# Patient Record
Sex: Male | Born: 1940
Health system: Southern US, Community
[De-identification: ages and names within clinical notes are randomized; demographics above are authoritative.]

## PROBLEM LIST (undated history)

## (undated) DIAGNOSIS — N400 Enlarged prostate without lower urinary tract symptoms: Secondary | ICD-10-CM

## (undated) DIAGNOSIS — R519 Headache, unspecified: Secondary | ICD-10-CM

## (undated) DIAGNOSIS — K4090 Unilateral inguinal hernia, without obstruction or gangrene, not specified as recurrent: Secondary | ICD-10-CM

## (undated) DIAGNOSIS — T6701XA Heatstroke and sunstroke, initial encounter: Secondary | ICD-10-CM

## (undated) DIAGNOSIS — G4730 Sleep apnea, unspecified: Secondary | ICD-10-CM

## (undated) DIAGNOSIS — Z87442 Personal history of urinary calculi: Secondary | ICD-10-CM

## (undated) DIAGNOSIS — R51 Headache: Secondary | ICD-10-CM

## (undated) DIAGNOSIS — E785 Hyperlipidemia, unspecified: Secondary | ICD-10-CM

## (undated) DIAGNOSIS — N289 Disorder of kidney and ureter, unspecified: Secondary | ICD-10-CM

## (undated) DIAGNOSIS — M1120 Other chondrocalcinosis, unspecified site: Secondary | ICD-10-CM

## (undated) HISTORY — DX: Heatstroke and sunstroke, initial encounter: T67.01XA

## (undated) HISTORY — DX: Sleep apnea, unspecified: G47.30

## (undated) HISTORY — DX: Other chondrocalcinosis, unspecified site: M11.20

## (undated) HISTORY — PX: COLONOSCOPY: SHX174

## (undated) HISTORY — PX: EYE SURGERY: SHX253

## (undated) HISTORY — PX: HERNIA REPAIR: SHX51

## (undated) HISTORY — DX: Benign prostatic hyperplasia without lower urinary tract symptoms: N40.0

## (undated) HISTORY — DX: Unilateral inguinal hernia, without obstruction or gangrene, not specified as recurrent: K40.90

---

## 2012-11-29 LAB — PSA

## 2014-02-17 ENCOUNTER — Ambulatory Visit: Payer: Self-pay | Admitting: Family Medicine

## 2014-02-28 HISTORY — PX: CATARACT EXTRACTION: SUR2

## 2014-07-29 ENCOUNTER — Encounter: Payer: Self-pay | Admitting: Emergency Medicine

## 2014-07-29 ENCOUNTER — Emergency Department
Admission: EM | Admit: 2014-07-29 | Discharge: 2014-07-29 | Disposition: A | Discharge status: Home / Self Care | Payer: Medicare PPO | Attending: Emergency Medicine | Admitting: Emergency Medicine

## 2014-07-29 DIAGNOSIS — K922 Gastrointestinal hemorrhage, unspecified: Secondary | ICD-10-CM | POA: Diagnosis not present

## 2014-07-29 DIAGNOSIS — K625 Hemorrhage of anus and rectum: Secondary | ICD-10-CM | POA: Diagnosis present

## 2014-07-29 HISTORY — DX: Disorder of kidney and ureter, unspecified: N28.9

## 2014-07-29 HISTORY — DX: Hyperlipidemia, unspecified: E78.5

## 2014-07-29 LAB — CBC
HEMATOCRIT: 44.7 % (ref 40.0–52.0)
HEMOGLOBIN: 14.6 g/dL (ref 13.0–18.0)
MCH: 29.7 pg (ref 26.0–34.0)
MCHC: 32.6 g/dL (ref 32.0–36.0)
MCV: 91.1 fL (ref 80.0–100.0)
Platelets: 262 10*3/uL (ref 150–440)
RBC: 4.91 MIL/uL (ref 4.40–5.90)
RDW: 13 % (ref 11.5–14.5)
WBC: 7.9 10*3/uL (ref 3.8–10.6)

## 2014-07-29 LAB — COMPREHENSIVE METABOLIC PANEL
ALK PHOS: 71 U/L (ref 38–126)
ALT: 20 U/L (ref 17–63)
ANION GAP: 10 (ref 5–15)
AST: 22 U/L (ref 15–41)
Albumin: 4.3 g/dL (ref 3.5–5.0)
BILIRUBIN TOTAL: 0.4 mg/dL (ref 0.3–1.2)
BUN: 22 mg/dL — AB (ref 6–20)
CO2: 27 mmol/L (ref 22–32)
Calcium: 9.6 mg/dL (ref 8.9–10.3)
Chloride: 103 mmol/L (ref 101–111)
Creatinine, Ser: 1.08 mg/dL (ref 0.61–1.24)
GFR calc non Af Amer: >60 mL/min (ref >60)
Glucose, Bld: 67 mg/dL (ref 65–99)
POTASSIUM: 4.5 mmol/L (ref 3.5–5.1)
SODIUM: 140 mmol/L (ref 135–145)
Total Protein: 7 g/dL (ref 6.5–8.1)

## 2014-07-29 LAB — TYPE AND SCREEN
ABO/RH(D): O POS
ANTIBODY SCREEN: NEGATIVE

## 2014-07-29 NOTE — ED Notes (Signed)
Pt presents with constipation since last Wednesday has had soft stools after giving himself an enema. Pt states started having some rectal bleeding on Thursday and is getting worse. Pt denies any pain but c/o weakness.

## 2014-07-29 NOTE — Discharge Instructions (Signed)
You were evaluated for bright red blood after constipation and enema use for the past few days. Your exam and evaluation the emergency department are reassuring. We discussed staying overnight for monitoring and observation versus going home, and we decided to go ahead let to go home tonight. Return to the emergency department for any new or worsening symptoms including dizziness, chest pain, shortness of breath, passing out, black stools, or any larger amount of passing bright red blood. We discussed stop the aspirin until you stop bleeding and at least 3 days after he stopped bleeding. Discussed with your primary care doctor or gastric neurologist about restarting the aspirin. You are referred to follow-up with a gastroenterologist, Dr. Marva Panda. Call tomorrow to make an appointment hopefully for this week.  Bloody Stools Bloody stools often mean that there is a problem in the digestive tract. Your caregiver may use the term "melena" to describe black, tarry, and bad smelling stools or "hematochezia" to describe red or maroon-colored stools. Blood seen in the stool can be caused by bleeding anywhere along the intestinal tract.  A black stool usually means that blood is coming from the upper part of the gastrointestinal tract (esophagus, stomach, or small bowel). Passing maroon-colored stools or bright red blood usually means that blood is coming from lower down in the large bowel or the rectum. However, sometimes massive bleeding in the stomach or small intestine can cause bright red bloody stools.  Consuming black licorice, lead, iron pills, medicines containing bismuth subsalicylate, or blueberries can also cause black stools. Your caregiver can test black stools to see if blood is present. It is important that the cause of the bleeding be found. Treatment can then be started, and the problem can be corrected. Rectal bleeding may not be serious, but you should not assume everything is okay until you know  the cause.It is very important to follow up with your caregiver or a specialist in gastrointestinal problems. CAUSES  Blood in the stools can come from various underlying causes.Often, the cause is not found during your first visit. Testing is often needed to discover the cause of bleeding in the gastrointestinal tract. Causes range from simple to serious or even life-threatening.Possible causes include:  Hemorrhoids.These are veins that are full of blood (engorged) in the rectum. They cause pain, inflammation, and may bleed.  Anal fissures.These are areas of painful tearing which may bleed. They are often caused by passing hard stool.  Diverticulosis.These are pouches that form on the colon over time, with age, and may bleed significantly.  Diverticulitis.This is inflammation in areas with diverticulosis. It can cause pain, fever, and bloody stools, although bleeding is rare.  Proctitis and colitis. These are inflamed areas of the rectum or colon. They may cause pain, fever, and bloody stools.  Polyps and cancer. Colon cancer is a leading cause of preventable cancer death.It often starts out as precancerous polyps that can be removed during a colonoscopy, preventing progression into cancer. Sometimes, polyps and cancer may cause rectal bleeding.  Gastritis and ulcers.Bleeding from the upper gastrointestinal tract (near the stomach) may travel through the intestines and produce black, sometimes tarry, often bad smelling stools. In certain cases, if the bleeding is fast enough, the stools may not be black, but red and the condition may be life-threatening. SYMPTOMS  You may have stools that are bright red and bloody, that are normal color with blood on them, or that are dark black and tarry. In some cases, you may only have blood in  the toilet bowl. Any of these cases need medical care. You may also have:  Pain at the anus or anywhere in the rectum.  Lightheadedness or feeling  faint.  Extreme weakness.  Nausea or vomiting.  Fever. DIAGNOSIS Your caregiver may use the following methods to find the cause of your bleeding:  Taking a medical history. Age is important. Older people tend to develop polyps and cancer more often. If there is anal pain and a hard, large stool associated with bleeding, a tear of the anus may be the cause. If blood drips into the toilet after a bowel movement, bleeding hemorrhoids may be the problem. The color and frequency of the bleeding are additional considerations. In most cases, the medical history provides clues, but seldom the final answer.  A visual and finger (digital) exam. Your caregiver will inspect the anal area, looking for tears and hemorrhoids. A finger exam can provide information when there is tenderness or a growth inside. In men, the prostate is also examined.  Endoscopy. Several types of small, long scopes (endoscopes) are used to view the colon.  In the office, your caregiver may use a rigid, or more commonly, a flexible viewing sigmoidoscope. This exam is called flexible sigmoidoscopy. It is performed in 5 to 10 minutes.  A more thorough exam is accomplished with a colonoscope. It allows your caregiver to view the entire 5 to 6 foot long colon. Medicine to help you relax (sedative) is usually given for this exam. Frequently, a bleeding lesion may be present beyond the reach of the sigmoidoscope. So, a colonoscopy may be the best exam to start with. Both exams are usually done on an outpatient basis. This means the patient does not stay overnight in the hospital or surgery center.  An upper endoscopy may be needed to examine your stomach. Sedation is used and a flexible endoscope is put in your mouth, down to your stomach.  A barium enema X-ray. This is an X-ray exam. It uses liquid barium inserted by enema into the rectum. This test alone may not identify an actual bleeding point. X-rays highlight abnormal shadows, such  as those made by lumps (tumors), diverticuli, or colitis. TREATMENT  Treatment depends on the cause of your bleeding.   For bleeding from the stomach or colon, the caregiver doing your endoscopy or colonoscopy may be able to stop the bleeding as part of the procedure.  Inflammation or infection of the colon can be treated with medicines.  Many rectal problems can be treated with creams, suppositories, or warm baths.  Surgery is sometimes needed.  Blood transfusions are sometimes needed if you have lost a lot of blood.  For any bleeding problem, let your caregiver know if you take aspirin or other blood thinners regularly. HOME CARE INSTRUCTIONS   Take any medicines exactly as prescribed.  Keep your stools soft by eating a diet high in fiber. Prunes (1 to 3 a day) work well for many people.  Drink enough water and fluids to keep your urine clear or pale yellow.  Take sitz baths if advised. A sitz bath is when you sit in a bathtub with warm water for 10 to 15 minutes to soak, soothe, and cleanse the rectal area.  If enemas or suppositories are advised, be sure you know how to use them. Tell your caregiver if you have problems with this.  Monitor your bowel movements to look for signs of improvement or worsening. SEEK MEDICAL CARE IF:   You do not  improve in the time expected.  Your condition worsens after initial improvement.  You develop any new symptoms. SEEK IMMEDIATE MEDICAL CARE IF:   You develop severe or prolonged rectal bleeding.  You vomit blood.  You feel weak or faint.  You have a fever. MAKE SURE YOU:  Understand these instructions.  Will watch your condition.  Will get help right away if you are not doing well or get worse. Document Released: 02/04/2002 Document Revised: 05/09/2011 Document Reviewed: 07/02/2010 [facility] Patient Information 2015 [place] [place]. This information is not intended to replace advice given to you by your health care provider.  Make sure you discuss any questions you have with your health care provider.

## 2014-07-29 NOTE — ED Provider Notes (Signed)
[facility] Emergency Department Provider Note   ____________________________________________  Time seen: 6:30 PM I have reviewed the triage vital signs and the triage nursing note.  HISTORY  Chief Complaint Rectal Bleeding   Historian Patient and wife  HPI Henry Lane is a 74 y.o. male  who is complaining of bright red blood per rectum. He's had constipation recently and tried an enema on Thursday. At that time it was kind of painful after that he did notice some bright red rectal bleeding. He's had no abdominal pain since then. He has had some relief of the constipation although he is still passing hard small bowel movements. He's never had rectal or GI bleeding in the past. He does take meloxicam for knee pain however he's not had any stomach pain or melena. He's not had any dizziness, chest pain, shortness breath, dizziness, passing out. He quantifies the amount is small. Symptoms are mild. He has had a colonoscopy over 5 years ago. He takes a baby aspirin a cause of his "age ". He's had small amount of bleeding multiple times per day since Thursday.    Past Medical History  Diagnosis Date  . Renal disorder   . Hyperlipidemia     There are no active problems to display for this patient.   History reviewed. No pertinent past surgical history.  No current outpatient prescriptions on file.  Aspirin  Allergies Review of patient's allergies indicates no known allergies.  No family history on file.  Social History History  Substance Use Topics  . Smoking status: Never Smoker   . Smokeless tobacco: Not on file  . Alcohol Use: No    Review of Systems  Constitutional: Negative for fever. Eyes: Negative for visual changes. ENT: Negative for sore throat. Cardiovascular: Negative for chest pain. Respiratory: Negative for shortness of breath. Gastrointestinal: Negative for abdominal pain, vomiting and diarrhea. Genitourinary: Negative for  dysuria. Musculoskeletal: Negative for back pain. Skin: Negative for rash. Neurological: Negative for headaches, focal weakness or numbness.  ____________________________________________   PHYSICAL EXAM:  VITAL SIGNS: ED Triage Vitals  Enc Vitals Group     BP 07/29/14 1521 135/69 mmHg     Pulse Rate 07/29/14 1521 65     Resp 07/29/14 1521 18     Temp 07/29/14 1521 98.9 F (37.2 C)     Temp Source 07/29/14 1521 Oral     SpO2 07/29/14 1521 100 %     Weight 07/29/14 1521 153 lb (69.4 kg)     Height 07/29/14 1521  (1.778 m)     Head Cir --      Peak Flow --      Pain Score --      Pain Loc --      Pain Edu? --      Excl. in GC? --      Constitutional: Alert and oriented. Well appearing and in no distress. Eyes: Conjunctivae are normal. PERRL. Normal extraocular movements. ENT   Head: Normocephalic and atraumatic.   Nose: No congestion/rhinnorhea.   Mouth/Throat: Mucous membranes are moist.   Neck: No stridor. Cardiovascular: Normal rate, regular rhythm.  No murmurs, rubs, or gallops. Respiratory: Normal respiratory effort without tachypnea nor retractions. Breath sounds are clear and equal bilaterally. No wheezes/rales/rhonchi. Gastrointestinal: Soft and nontender. No distention.  Genitourinary: Small external hemorrhoid, noninflamed. Rectal exam nontender and no masses or stool palpated. Small pink/red on glove Musculoskeletal: Nontender with normal range of motion in all extremities. No joint effusions.  No lower extremity tenderness nor edema. Neurologic:  Normal speech and language. No gross focal neurologic deficits are appreciated. Skin:  Skin is warm, dry and intact. No rash noted. Psychiatric: Mood and affect are normal. Speech and behavior are normal. Patient exhibits appropriate insight and judgment.  ____________________________________________   EKG  None ____________________________________________  LABS (pertinent  positives/negatives)  Hemoglobin 14.6, white blood count normal 7.9 Complete metabolic panel within normal limits   ____________________________________________  RADIOLOGY Radiologist results reviewed  None __________________________________________  PROCEDURES  Procedure(s) performed: None Critical Care performed: None  ____________________________________________   ED COURSE / ASSESSMENT AND PLAN  Pertinent labs & imaging results that were available during my care of the patient were reviewed by me and considered in my medical decision making (see chart for details).   Patient has had a small amount of bright red blood per rectum since having constipation and an enema on Thursday. His vitals, exam, and hemoglobin are stable and normal. I discussed with him possibly staying overnight for observation recheck a hemoglobin and check consultation, or go home and return for any new or worsening problems and have follow-up at the GI clinic. The patient and his wife would like to go home I do not think this is unreasonable. I do believe the bleeding is likely due to minimal trauma due to the enema and constipation rather than a more serious or emergency causes of GI bleeding. He does take meloxicam but I do not believe he is having an upper GI bleed. He has absolutely no abdominal pain or nausea. Has no other symptoms. Return precautions and discharge instruction were provided to patient. They understand to come back immediately for any new or worsening condition.   ___________________________________________   FINAL CLINICAL IMPRESSION(S) / ED DIAGNOSES   Final diagnoses:  Lower GI bleeding      Governor Rooksebecca Dao Memmott, MD 07/29/14 1910

## 2014-07-29 NOTE — ED Notes (Signed)
E - sig pad not working, pt received instructions and had all questions answered prior to dc.  Declined wheelchair and walked out.

## 2014-07-30 LAB — ABO/RH: ABO/RH(D): O POS

## 2014-08-04 ENCOUNTER — Ambulatory Visit: Payer: Self-pay | Admitting: Urgent Care

## 2014-08-04 ENCOUNTER — Telehealth: Payer: Self-pay | Admitting: Urgent Care

## 2014-08-04 NOTE — Telephone Encounter (Signed)
Ms Brendia Sacksudrey Dormer called wanting to know why her husbands [facility] was [facility] this [facility]. After speaking with Marcelino DusterMichelle in [place] office, I called Ms Margo AyeHall back and told her Marcelino DusterMichelle had spoken with her son Mellody DanceKeith earlier this morning and he stated the patient was doing fine, leaving to go out of town today and would like to cancel the appointment today and reschedule when they return from their trip. I assured Mrs Margo AyeHall that I spoke with the nurse and the nurse stated that if the patient had no active, visible bleeding it would be fine to reschedule to next week. Mrs Margo AyeHall was satisfied with that and rescheduled patient appointment to next week with Lorenza BurtonKandice Jones, NP. They will call if there are any problems before then.

## 2014-08-14 ENCOUNTER — Ambulatory Visit: Payer: Self-pay | Admitting: Urgent Care

## 2014-11-14 ENCOUNTER — Ambulatory Visit (INDEPENDENT_AMBULATORY_CARE_PROVIDER_SITE_OTHER): Payer: Medicare PPO | Admitting: Family Medicine

## 2014-11-14 ENCOUNTER — Encounter: Payer: Self-pay | Admitting: Family Medicine

## 2014-11-14 VITALS — BP 114/58 | HR 73 | Temp 98.1°F | Ht 68.0 in | Wt 150.0 lb

## 2014-11-14 DIAGNOSIS — E785 Hyperlipidemia, unspecified: Secondary | ICD-10-CM

## 2014-11-14 DIAGNOSIS — N400 Enlarged prostate without lower urinary tract symptoms: Secondary | ICD-10-CM | POA: Diagnosis not present

## 2014-11-14 DIAGNOSIS — Z5181 Encounter for therapeutic drug level monitoring: Secondary | ICD-10-CM

## 2014-11-14 DIAGNOSIS — M170 Bilateral primary osteoarthritis of knee: Secondary | ICD-10-CM | POA: Insufficient documentation

## 2014-11-14 DIAGNOSIS — M1120 Other chondrocalcinosis, unspecified site: Secondary | ICD-10-CM | POA: Insufficient documentation

## 2014-11-14 DIAGNOSIS — M129 Arthropathy, unspecified: Secondary | ICD-10-CM | POA: Diagnosis not present

## 2014-11-14 MED ORDER — LOVASTATIN 20 MG PO TABS: 20.0000 mg | ORAL_TABLET | Freq: Every day | ORAL | Status: DC

## 2014-11-14 MED ORDER — OMEGA-3-ACID ETHYL ESTERS 1 G PO CAPS: 1.0000 g | ORAL_CAPSULE | Freq: Two times a day (BID) | ORAL | Status: DC

## 2014-11-14 MED ORDER — TAMSULOSIN HCL 0.4 MG PO CAPS: 0.4000 mg | ORAL_CAPSULE | Freq: Every day | ORAL | Status: DC

## 2014-11-14 MED ORDER — FINASTERIDE 5 MG PO TABS: 5.0000 mg | ORAL_TABLET | Freq: Every day | ORAL | Status: DC

## 2014-11-14 NOTE — Assessment & Plan Note (Signed)
Check PSA today; reviewed urologist notes; continue medicines

## 2014-11-14 NOTE — Assessment & Plan Note (Signed)
With recent injection in the right knee; will be aware if glucose is a little elevated on today's nonfasting labs; he is on glucosamine-chondroitin, and has an orthopaedist that he will contact if/when he feels like he is in need of another injection

## 2014-11-14 NOTE — Progress Notes (Signed)
BP 114/58 mmHg  Pulse 73  Temp(Src) 98.1 F (36.7 C)  Ht  (1.727 m)  Wt 150 lb (68.04 kg)  BMI 22.81 kg/m2  SpO2 97%   Subjective:    Patient ID: Henry Lane, Henry Lane    DOB: May 30, 1940, 74 y.o.   MRN: [id number]  HPI: Henry Lane is a 74 y.o. Henry Lane  Chief Complaint  Patient presents with  . Hyperlipidemia    pt states he needs a refill on lovastatin   He has had knee pain; injury years ago, fell in a hole and hit left knee with cement; orthopaedist gave him a shot in the left knee around Christmas; he got an injection in the right knee not long ago  He has high cholesterol; needs refills of medicines; ate right much eggs last week, big breakfast every day; usually eats cereal every day but out to eat on Wednesdays and once on the weekend goes out; not much cheese; does have bacon with the eggs; occasional meat; mostly chicken and fish  He is taking pills for the large prostate; thinks last exam might be a year; we reviewed his last PSA done here (4.1) and PSA done at urologist (4.3); he is not sure the last time he saw his urologist, but our last note was March 2015  He had an episode of GI bleeding a while back he tells as we get ready to finish up; he went to the ER and they checked him out; they told him it was some medicine he was taking for his knees; he stopped that and everything has cleared up and he says he's fine; just wanted me to know  Relevant past medical, surgical, family and social history reviewed and updated as indicated. Interim medical history since our last visit reviewed. Past Medical History  Diagnosis Date  . Renal disorder   . Hyperlipidemia   . Benign enlargement of prostate   . Calcium pyrophosphate crystal disease    Allergies and medications reviewed and updated.  Review of Systems  Constitutional: Negative for unexpected weight change.  Respiratory: Negative for wheezing.   Cardiovascular: Negative for chest pain.  Genitourinary:  Positive for decreased urine volume (sometimes a weaker) and difficulty urinating. Negative for dysuria and hematuria.  Musculoskeletal: Positive for arthralgias.  Neurological: Negative for tremors.   Per HPI unless specifically indicated above     Objective:    BP 114/58 mmHg  Pulse 73  Temp(Src) 98.1 F (36.7 C)  Ht  (1.727 m)  Wt 150 lb (68.04 kg)  BMI 22.81 kg/m2  SpO2 97%  Wt Readings from Last 3 Encounters:  11/14/14 150 lb (68.04 kg)  04/28/14 154 lb (69.854 kg)  07/29/14 153 lb (69.4 kg)    Physical Exam  Constitutional: He appears well-developed and well-nourished. No distress.  HENT:  Head: Normocephalic and atraumatic.  Eyes: EOM are normal. No scleral icterus.  Neck: No thyromegaly present.  Cardiovascular: Normal rate and regular rhythm.   Pulmonary/Chest: Effort normal and breath sounds normal.  Abdominal: Soft. Bowel sounds are normal. He exhibits no distension.  Genitourinary: Rectal exam shows no mass. Prostate is enlarged (2+; no nodules, symmetric). Prostate is not tender.  Musculoskeletal: He exhibits no edema.  Neurological: Coordination normal.  Skin: Skin is warm and dry. No pallor.  Psychiatric: He has a normal mood and affect. His behavior is normal. Judgment and thought content normal.    Results for orders placed or performed in visit on  11/14/14  PSA  Result Value Ref Range   PSA from PP       Assessment & Plan:   Problem List Items Addressed This Visit      Musculoskeletal and Integument   Arthritis of both knees    With recent injection in the right knee; will be aware if glucose is a little elevated on today's nonfasting labs; he is on glucosamine-chondroitin, and has an orthopaedist that he will contact if/when he feels like he is in need of another injection        Genitourinary   Benign enlargement of prostate    Check PSA today; reviewed urologist notes; continue medicines      Relevant Medications   tamsulosin  (FLOMAX) 0.4 MG CAPS capsule   finasteride (PROSCAR) 5 MG tablet   Other Relevant Orders   PSA     Other   Hyperlipidemia - Primary    Check lipids today; cereal for breakfast, pack of Nabs and Pepsi and peanuts and popcorn for lunch; limit eggs and saturated fats; continue statin      Relevant Medications   lovastatin (MEVACOR) 20 MG tablet   omega-3 acid ethyl esters (LOVAZA) 1 G capsule   Other Relevant Orders   Lipid Panel w/o Chol/HDL Ratio   Medication monitoring encounter   Relevant Orders   Comprehensive metabolic panel       Follow up plan: Return in about 6 months (around 05/14/2015) for regular follow-up.   An after-visit summary was printed and given to the patient at check-out.  Please see the patient instructions which may contain other information and recommendations beyond what is mentioned above in the assessment and plan.  Meds ordered this encounter  Medications  . tamsulosin (FLOMAX) 0.4 MG CAPS capsule    Sig: Take 1 capsule (0.4 mg total) by mouth daily.    Dispense:  30 capsule    Refill:  12  . lovastatin (MEVACOR) 20 MG tablet    Sig: Take 1 tablet (20 mg total) by mouth at bedtime.    Dispense:  30 tablet    Refill:  6  . omega-3 acid ethyl esters (LOVAZA) 1 G capsule    Sig: Take 1 capsule (1 g total) by mouth 2 (two) times daily.    Dispense:  60 capsule    Refill:  12  . finasteride (PROSCAR) 5 MG tablet    Sig: Take 1 tablet (5 mg total) by mouth daily.    Dispense:  30 tablet    Refill:  12   Orders Placed This Encounter  Procedures  . Lipid Panel w/o Chol/HDL Ratio  . Comprehensive metabolic panel  . PSA

## 2014-11-14 NOTE — Patient Instructions (Addendum)
Try to limit eggs and bacon and sausage Try to get no more than three egg yolks per week We'll contact you with the lab results Stay on your medicines Try turmeric as a natural anti-inflammatory (for pain and arthritis). It comes in capsules where you buy aspirin and fish oil, but also as a spice where you buy pepper and garlic powder. Return in 6 months, but call sooner if needed Read about saw palmetto supplement for your prostate

## 2014-11-14 NOTE — Assessment & Plan Note (Signed)
Check lipids today; cereal for breakfast, pack of Nabs and Pepsi and peanuts and popcorn for lunch; limit eggs and saturated fats; continue statin

## 2014-11-15 LAB — COMPREHENSIVE METABOLIC PANEL
ALBUMIN: 4.5 g/dL (ref 3.5–4.8)
ALT: 14 IU/L (ref 0–44)
AST: 18 IU/L (ref 0–40)
Albumin/Globulin Ratio: 2.1 (ref 1.1–2.5)
Alkaline Phosphatase: 81 IU/L (ref 39–117)
BILIRUBIN TOTAL: 0.6 mg/dL (ref 0.0–1.2)
BUN / CREAT RATIO: 27 — AB (ref 10–22)
BUN: 23 mg/dL (ref 8–27)
CALCIUM: 9.7 mg/dL (ref 8.6–10.2)
CO2: 24 mmol/L (ref 18–29)
Chloride: 101 mmol/L (ref 97–108)
Creatinine, Ser: 0.85 mg/dL (ref 0.76–1.27)
GFR calc Af Amer: 99 mL/min/{1.73_m2} (ref >59)
GFR, EST NON AFRICAN AMERICAN: 85 mL/min/{1.73_m2} (ref >59)
GLUCOSE: 77 mg/dL (ref 65–99)
Globulin, Total: 2.1 g/dL (ref 1.5–4.5)
Potassium: 5 mmol/L (ref 3.5–5.2)
Sodium: 142 mmol/L (ref 134–144)
TOTAL PROTEIN: 6.6 g/dL (ref 6.0–8.5)

## 2014-11-15 LAB — LIPID PANEL W/O CHOL/HDL RATIO
Cholesterol, Total: 163 mg/dL (ref 100–199)
HDL: 61 mg/dL (ref >39)
LDL Calculated: 58 mg/dL (ref 0–99)
TRIGLYCERIDES: 222 mg/dL — AB (ref 0–149)
VLDL Cholesterol Cal: 44 mg/dL — ABNORMAL HIGH (ref 5–40)

## 2014-11-15 LAB — PSA: Prostate Specific Ag, Serum: 1.4 ng/mL (ref 0.0–4.0)

## 2014-11-17 ENCOUNTER — Encounter: Payer: Self-pay | Admitting: Family Medicine

## 2015-02-03 ENCOUNTER — Telehealth: Payer: Self-pay

## 2015-02-03 NOTE — Telephone Encounter (Signed)
[facility] [place] and his wife needing refills. I explained that the patient's use [facility] and they didn't need refills.  The pharmacist Trey Paula(Jeff) said that he knew that, but the mail order hasn't came and [facility] [place] [facility] [place] [facility] [place]. He asked if he could get a verbal order for a week and I agreed. A week supply of lovastatin.  Number to that pharmacy is [phone]

## 2015-04-14 ENCOUNTER — Other Ambulatory Visit: Payer: Self-pay | Admitting: Family Medicine

## 2015-04-14 NOTE — Telephone Encounter (Signed)
Last labs Sept 2016 reviewed Rx approved

## 2015-05-14 ENCOUNTER — Ambulatory Visit: Payer: Medicare PPO | Admitting: Family Medicine

## 2015-05-27 ENCOUNTER — Ambulatory Visit: Payer: Medicare PPO | Admitting: Family Medicine

## 2015-06-02 ENCOUNTER — Ambulatory Visit (INDEPENDENT_AMBULATORY_CARE_PROVIDER_SITE_OTHER): Payer: Medicare PPO | Admitting: Family Medicine

## 2015-06-02 ENCOUNTER — Encounter: Payer: Self-pay | Admitting: Family Medicine

## 2015-06-02 VITALS — BP 116/58 | HR 72 | Temp 98.1°F | Resp 14 | Wt 153.0 lb

## 2015-06-02 DIAGNOSIS — E785 Hyperlipidemia, unspecified: Secondary | ICD-10-CM | POA: Diagnosis not present

## 2015-06-02 DIAGNOSIS — N400 Enlarged prostate without lower urinary tract symptoms: Secondary | ICD-10-CM | POA: Diagnosis not present

## 2015-06-02 DIAGNOSIS — M129 Arthropathy, unspecified: Secondary | ICD-10-CM

## 2015-06-02 DIAGNOSIS — M170 Bilateral primary osteoarthritis of knee: Secondary | ICD-10-CM

## 2015-06-02 DIAGNOSIS — Z5181 Encounter for therapeutic drug level monitoring: Secondary | ICD-10-CM | POA: Diagnosis not present

## 2015-06-02 MED ORDER — FINASTERIDE 5 MG PO TABS: 5.0000 mg | ORAL_TABLET | Freq: Every day | ORAL | Status: DC

## 2015-06-02 MED ORDER — LOVASTATIN 20 MG PO TABS: 20.0000 mg | ORAL_TABLET | Freq: Every day | ORAL | Status: DC

## 2015-06-02 MED ORDER — TAMSULOSIN HCL 0.4 MG PO CAPS: 0.4000 mg | ORAL_CAPSULE | Freq: Every day | ORAL | Status: DC

## 2015-06-02 NOTE — Assessment & Plan Note (Signed)
Seeing orthopaedist, just had injections yesterday

## 2015-06-02 NOTE — Assessment & Plan Note (Signed)
Check sgpt and creatinine 

## 2015-06-02 NOTE — Assessment & Plan Note (Addendum)
Check PSA in September

## 2015-06-02 NOTE — Progress Notes (Signed)
BP 116/58 mmHg  Pulse 72  Temp(Src) 98.1 F (36.7 C) (Oral)  Resp 14  Wt 153 lb (69.4 kg)  SpO2 96%   Subjective:    Patient ID: Henry Lane, male    DOB: May 04, 1940, 75 y.o.   MRN: [id number]  HPI: Henry Lane is a 75 y.o. male  Chief Complaint  Patient presents with  . Medication Refill  . Hyperlipidemia   He saw the orthopaedist yesterday and got shots in both knees; never had any problems in the past with those (such as elevated sugars); injection sites are doing well; the right one bled a little in the office and they had him apply some pressure; otherwise, all is well; he is going to a car show this weekend and hopes to be able to walk around a lot  High cholesterol; tolerating statin just fine; we reviewed last lipid panel Lab Results  Component Value Date   CHOL 163 11/14/2014   Lab Results  Component Value Date   HDL 61 11/14/2014   Lab Results  Component Value Date   LDLCALC 58 11/14/2014   Lab Results  Component Value Date   TRIG 222* 11/14/2014   No results found for: CHOLHDL No results found for: LDLDIRECT  Takes prostate medicines; can tell a difference if he misses the dose; he saw a urologist; last PSA 1.4 in Sept 2016  Relevant past medical, surgical, family and social history reviewed and updated as indicated. Interim medical history since our last visit reviewed. Allergies and medications reviewed and updated.  Review of Systems  Per HPI unless specifically indicated above     Objective:    BP 116/58 mmHg  Pulse 72  Temp(Src) 98.1 F (36.7 C) (Oral)  Resp 14  Wt 153 lb (69.4 kg)  SpO2 96%  Wt Readings from Last 3 Encounters:  06/02/15 153 lb (69.4 kg)  11/14/14 150 lb (68.04 kg)  04/28/14 154 lb (69.854 kg)    Physical Exam  Constitutional: He appears well-developed and well-nourished. No distress.  Looks younger than stated age; spry, bright, energetic; no distress  Cardiovascular: Normal rate and regular rhythm.     Pulmonary/Chest: Effort normal and breath sounds normal.  Musculoskeletal:  Full extension of both knees; slight swelling at injection site right anterolateral knee; no erythema; no increased warmth  Psychiatric: He has a normal mood and affect. His speech is normal.    Results for orders placed or performed in visit on 11/14/14  Lipid Panel w/o Chol/HDL Ratio  Result Value Ref Range   Cholesterol, Total 163 100 - 199 mg/dL   Triglycerides 409222 (H) 0 - 149 mg/dL   HDL 61 >81>39 mg/dL   VLDL Cholesterol Cal 44 (H) 5 - 40 mg/dL   LDL Calculated 58 0 - 99 mg/dL  Comprehensive metabolic panel  Result Value Ref Range   Glucose 77 65 - 99 mg/dL   BUN 23 8 - 27 mg/dL   Creatinine, Ser 1.910.85 0.76 - 1.27 mg/dL   GFR calc non Af Amer 85 >59 mL/min/1.73   GFR calc Af Amer 99 >59 mL/min/1.73   BUN/Creatinine Ratio 27 (H) 10 - 22   Sodium 142 134 - 144 mmol/L   Potassium 5.0 3.5 - 5.2 mmol/L   Chloride 101 97 - 108 mmol/L   CO2 24 18 - 29 mmol/L   Calcium 9.7 8.6 - 10.2 mg/dL   Total Protein 6.6 6.0 - 8.5 g/dL   Albumin 4.5 3.5 - 4.8  g/dL   Globulin, Total 2.1 1.5 - 4.5 g/dL   Albumin/Globulin Ratio 2.1 1.1 - 2.5   Bilirubin Total 0.6 0.0 - 1.2 mg/dL   Alkaline Phosphatase 81 39 - 117 IU/L   AST 18 0 - 40 IU/L   ALT 14 0 - 44 IU/L  PSA  Result Value Ref Range   Prostate Specific Ag, Serum 1.4 0.0 - 4.0 ng/mL      Assessment & Plan:   Problem List Items Addressed This Visit      Musculoskeletal and Integument   Arthritis of both knees    Seeing orthopaedist, just had injections yesterday        Genitourinary   Benign enlargement of prostate    Check PSA in September      Relevant Medications   finasteride (PROSCAR) 5 MG tablet   tamsulosin (FLOMAX) 0.4 MG CAPS capsule     Other   Hyperlipidemia - Primary    Limit saturated fats, limit eggs to no more than 3 per weeks; continue statin      Relevant Medications   lovastatin (MEVACOR) 20 MG tablet   Other Relevant Orders    Lipid Panel w/o Chol/HDL Ratio   Medication monitoring encounter    Check sgpt and creatinine      Relevant Orders   Comprehensive metabolic panel      Follow up plan: Return in about 6 months (around 12/02/2015) for fasting labs, and a Medicare wellness visit when due.  Orders Placed This Encounter  Procedures  . Lipid Panel w/o Chol/HDL Ratio  . Comprehensive metabolic panel   Meds ordered this encounter  Medications  . lovastatin (MEVACOR) 20 MG tablet    Sig: Take 1 tablet (20 mg total) by mouth at bedtime.    Dispense:  90 tablet    Refill:  3  . finasteride (PROSCAR) 5 MG tablet    Sig: Take 1 tablet (5 mg total) by mouth daily.    Dispense:  90 tablet    Refill:  3  . tamsulosin (FLOMAX) 0.4 MG CAPS capsule    Sig: Take 1 capsule (0.4 mg total) by mouth daily.    Dispense:  90 capsule    Refill:  3   An after-visit summary was printed and given to the patient at check-out.  Please see the patient instructions which may contain other information and recommendations beyond what is mentioned above in the assessment and plan.

## 2015-06-02 NOTE — Patient Instructions (Signed)
We'll get labs today and contact you with the results Try to limit saturated fats in your diet (bologna, hot dogs, barbeque, cheeseburgers, hamburgers, steak, bacon, sausage, cheese, etc.) and get more fresh fruits, vegetables, and whole grains

## 2015-06-02 NOTE — Assessment & Plan Note (Signed)
Limit saturated fats, limit eggs to no more than 3 per weeks; continue statin

## 2015-06-03 ENCOUNTER — Encounter: Payer: Self-pay | Admitting: Family Medicine

## 2015-06-03 LAB — COMPREHENSIVE METABOLIC PANEL
A/G RATIO: 2.1 (ref 1.2–2.2)
ALBUMIN: 4.6 g/dL (ref 3.5–4.8)
ALT: 17 IU/L (ref 0–44)
AST: 20 IU/L (ref 0–40)
Alkaline Phosphatase: 75 IU/L (ref 39–117)
BILIRUBIN TOTAL: 0.5 mg/dL (ref 0.0–1.2)
BUN / CREAT RATIO: 37 — AB (ref 10–24)
BUN: 34 mg/dL — AB (ref 8–27)
CALCIUM: 9.7 mg/dL (ref 8.6–10.2)
CHLORIDE: 98 mmol/L (ref 96–106)
CO2: 23 mmol/L (ref 18–29)
Creatinine, Ser: 0.92 mg/dL (ref 0.76–1.27)
GFR calc non Af Amer: 81 mL/min/{1.73_m2} (ref >59)
GFR, EST AFRICAN AMERICAN: 93 mL/min/{1.73_m2} (ref >59)
Globulin, Total: 2.2 g/dL (ref 1.5–4.5)
Glucose: 106 mg/dL — ABNORMAL HIGH (ref 65–99)
POTASSIUM: 5.1 mmol/L (ref 3.5–5.2)
Sodium: 137 mmol/L (ref 134–144)
Total Protein: 6.8 g/dL (ref 6.0–8.5)

## 2015-06-03 LAB — LIPID PANEL W/O CHOL/HDL RATIO
Cholesterol, Total: 160 mg/dL (ref 100–199)
HDL: 79 mg/dL (ref >39)
LDL Calculated: 73 mg/dL (ref 0–99)
TRIGLYCERIDES: 42 mg/dL (ref 0–149)
VLDL Cholesterol Cal: 8 mg/dL (ref 5–40)

## 2015-07-13 ENCOUNTER — Other Ambulatory Visit: Payer: Self-pay | Admitting: Family Medicine

## 2015-07-13 ENCOUNTER — Encounter: Payer: Self-pay | Admitting: Family Medicine

## 2015-07-13 DIAGNOSIS — Z1211 Encounter for screening for malignant neoplasm of colon: Secondary | ICD-10-CM

## 2015-07-13 LAB — POC HEMOCCULT BLD/STL (HOME/3-CARD/SCREEN)
Card #2 Fecal Occult Blod, POC: NEGATIVE
Card #3 Fecal Occult Blood, POC: NEGATIVE
Fecal Occult Blood, POC: NEGATIVE

## 2015-07-31 ENCOUNTER — Telehealth: Payer: Self-pay | Admitting: Family Medicine

## 2015-07-31 NOTE — Telephone Encounter (Signed)
Thank you for the note; I'm sorry that we did not have any openings; I do recommend he get checked out by urgent care provider; thank you

## 2015-07-31 NOTE — Telephone Encounter (Signed)
Left voice mail

## 2015-07-31 NOTE — Telephone Encounter (Signed)
Wife called trying to schedule appointment today but we had nothing available. I did put him on cancellation list and also suggested urgent care. States he woke up with severe stomach pain with no diarrhea (in the middle of his stomach). By the time we ended the conversation his pain was over. I told her that i would send you a message making you aware of the situation.

## 2015-12-03 ENCOUNTER — Ambulatory Visit: Payer: Medicare PPO | Admitting: Family Medicine

## 2016-05-06 ENCOUNTER — Telehealth: Payer: Self-pay | Admitting: *Deleted

## 2016-05-06 ENCOUNTER — Encounter: Payer: Self-pay | Admitting: Family Medicine

## 2016-05-06 ENCOUNTER — Ambulatory Visit (INDEPENDENT_AMBULATORY_CARE_PROVIDER_SITE_OTHER): Payer: Medicare PPO | Admitting: Family Medicine

## 2016-05-06 ENCOUNTER — Telehealth: Payer: Self-pay

## 2016-05-06 DIAGNOSIS — K4090 Unilateral inguinal hernia, without obstruction or gangrene, not specified as recurrent: Secondary | ICD-10-CM | POA: Diagnosis not present

## 2016-05-06 DIAGNOSIS — R9431 Abnormal electrocardiogram [ECG] [EKG]: Secondary | ICD-10-CM | POA: Diagnosis not present

## 2016-05-06 DIAGNOSIS — G4719 Other hypersomnia: Secondary | ICD-10-CM

## 2016-05-06 DIAGNOSIS — G4730 Sleep apnea, unspecified: Secondary | ICD-10-CM | POA: Diagnosis not present

## 2016-05-06 HISTORY — DX: Unilateral inguinal hernia, without obstruction or gangrene, not specified as recurrent: K40.90

## 2016-05-06 LAB — CBC WITH DIFFERENTIAL/PLATELET
Basophils Absolute: 67 cells/uL (ref 0–200)
Basophils Relative: 1 %
Eosinophils Absolute: 201 cells/uL (ref 15–500)
Eosinophils Relative: 3 %
HCT: 45 % (ref 38.5–50.0)
Hemoglobin: 14.8 g/dL (ref 13.2–17.1)
LYMPHS PCT: 36 %
Lymphs Abs: 2412 cells/uL (ref 850–3900)
MCH: 30.3 pg (ref 27.0–33.0)
MCHC: 32.9 g/dL (ref 32.0–36.0)
MCV: 92 fL (ref 80.0–100.0)
MONOS PCT: 11 %
MPV: 10.6 fL (ref 7.5–12.5)
Monocytes Absolute: 737 cells/uL (ref 200–950)
Neutro Abs: 3283 cells/uL (ref 1500–7800)
Neutrophils Relative %: 49 %
PLATELETS: 295 10*3/uL (ref 140–400)
RBC: 4.89 MIL/uL (ref 4.20–5.80)
RDW: 13 % (ref 11.0–15.0)
WBC: 6.7 10*3/uL (ref 3.8–10.8)

## 2016-05-06 LAB — COMPLETE METABOLIC PANEL WITH GFR
ALT: 13 U/L (ref 9–46)
AST: 16 U/L (ref 10–35)
Albumin: 4.3 g/dL (ref 3.6–5.1)
Alkaline Phosphatase: 69 U/L (ref 40–115)
BUN: 22 mg/dL (ref 7–25)
CHLORIDE: 105 mmol/L (ref 98–110)
CO2: 28 mmol/L (ref 20–31)
CREATININE: 0.98 mg/dL (ref 0.70–1.18)
Calcium: 9.6 mg/dL (ref 8.6–10.3)
GFR, Est African American: 86 mL/min (ref >=60)
GFR, Est Non African American: 74 mL/min (ref >=60)
Glucose, Bld: 78 mg/dL (ref 65–99)
Potassium: 5.5 mmol/L — ABNORMAL HIGH (ref 3.5–5.3)
Sodium: 141 mmol/L (ref 135–146)
Total Bilirubin: 0.5 mg/dL (ref 0.2–1.2)
Total Protein: 6.7 g/dL (ref 6.1–8.1)

## 2016-05-06 NOTE — Assessment & Plan Note (Addendum)
Witnessed sleep apnea; strong suspicion that this precipitated the event; consult with Dr. Nicholos Johnsamachandran; explained this can be dangerous, even fatal; will try to see if emergency CPAP with auto-titration can be ordered (asking sleep specialist today)

## 2016-05-06 NOTE — Telephone Encounter (Signed)
Dr. Sherie DonLada called to speak with DS in regards to this pt needing a sleep study ASAP. Unable to get temporary CPAP until sleep study completed but will place an order for HST/ in lab which ever insurance will cover and schedule an appt with DR per DS.

## 2016-05-06 NOTE — Telephone Encounter (Signed)
Dr. Sung AmabileSimonds nurse called and stated he was not able to speak with Dr. lada because he was headed to the hospital. She stated they will try to get him an emergency temp CPAP machine but since the Pt has not head a sleep study done it might be hard to get the insurance company to pay for it.

## 2016-05-06 NOTE — Patient Instructions (Signed)
We'll have you see Dr. Nicholos Johnsamachandran as soon as possible We'll get the holter monitor and echocardiogram and labs Return in 10-14 days and we'll review results Call if needed If another event occurs, call 911

## 2016-05-06 NOTE — Telephone Encounter (Signed)
Thank you so much

## 2016-05-06 NOTE — Assessment & Plan Note (Addendum)
No priors for comparison; low voltage in the inferior leads (lead placement?); no pause, normal intervals; will get holter and echo; to ER, call 911 if episode occurs again

## 2016-05-06 NOTE — Assessment & Plan Note (Signed)
Refer to surgeon; explained risk of incarceration, reasons to go to the ER

## 2016-05-06 NOTE — Progress Notes (Signed)
BP 126/72   Pulse 81   Temp 97.9 F (36.6 C) (Oral)   Resp 16   Wt 157 lb 3 oz (71.3 kg)   SpO2 97%   BMI 23.90 kg/m    Subjective:    Patient ID: Henry Lane, male    DOB: 1940-12-23, 76 y.o.   MRN: [id number]  HPI: Henry Lane is a 76 y.o. male  Chief Complaint  Patient presents with  . Fatigue    clammy, pale and weakness and sweating at the beach. Last for 5-10 mins.  Wenesday night. Not happen after that .   Marland Kitchen. Hernia    Became bigger    He had an episode at the beach; lasted for 5-10 minutes; happened Wednesday night; clammy and pale and felt weak; he was sleeping; woke up; this was at 3 am; he got up out of bed, felt so weak that he crawled back in the bed; no funny heart beats; just felt weird; ate early that day wife thinks, then said no, they ate about 6 pm; event was at 3 am; just went away on its own No chest pain; no nausea; no weakness; no bad headaches; no trouble with speaking or swallowing; no leg swelling, no SHOB He snores heavily and stops breathing quite often says his wife; doesn't matter what position; this even happens in the chair; one night he stopped breathing so long, his wife wondered if he was ever going to start back He will often wake himself up in the middle of the night struggling to breathe  Hernia has gotten bigger, left inguinal hernia; had it about 3-4 years, maybe longer; still soft; had one on the right side fixed a long time ago  Pain along the left elbow right on the bone  [facility] 05/06/2016 06/02/2015  Decreased Interest 0 0  Down, Depressed, Hopeless 0 0  PHQ - 2 Score 0 0   Relevant past medical, surgical, family and social history reviewed Past Medical History:  Diagnosis Date  . Benign enlargement of prostate   . Calcium pyrophosphate crystal disease   . Hyperlipidemia   . Left inguinal hernia 05/06/2016  . Renal disorder    Past Surgical History:  Procedure Laterality Date  . HERNIA REPAIR     right  inguinal   Family History  Problem Relation Age of Onset  . Cancer Father     lung   Social History  Substance Use Topics  . Smoking status: Never Smoker  . Smokeless tobacco: Never Used  . Alcohol use Yes     Comment: on occasion   Interim medical history since last visit reviewed. Allergies and medications reviewed  Review of Systems Per HPI unless specifically indicated above     Objective:    BP 126/72   Pulse 81   Temp 97.9 F (36.6 C) (Oral)   Resp 16   Wt 157 lb 3 oz (71.3 kg)   SpO2 97%   BMI 23.90 kg/m   Wt Readings from Last 3 Encounters:  05/06/16 157 lb 3 oz (71.3 kg)  06/02/15 153 lb (69.4 kg)  11/14/14 150 lb (68 kg)    Physical Exam  Constitutional: He appears well-developed and well-nourished. No distress.  HENT:  Head: Normocephalic and atraumatic.  Eyes: EOM are normal. No scleral icterus.  Neck: No JVD present. Carotid bruit is not present. No thyromegaly present.  Cardiovascular: Normal rate and regular rhythm.   No murmur heard. Pulmonary/Chest:  Effort normal and breath sounds normal.  Abdominal: Soft. Bowel sounds are normal. He exhibits no distension. A hernia is present. Hernia confirmed positive in the left inguinal area.  Musculoskeletal: He exhibits no edema.  Neurological: He displays no tremor. Coordination normal.  Reflex Scores:      Patellar reflexes are 2+ on the right side and 2+ on the left side. Skin: Skin is warm and dry. He is not diaphoretic. No pallor.  Psychiatric: He has a normal mood and affect. His behavior is normal. Judgment and thought content normal. His mood appears not anxious. He does not exhibit a depressed mood.   Results for orders placed or performed in visit on 07/13/15  POC Hemoccult Bld/Stl (3-Cd Home Screen)  Result Value Ref Range   Card #1 Date 06/26/2015    Fecal Occult Blood, POC Negative Negative   Card #2 Date 06/29/2015    Card #2 Fecal Occult Blod, POC Negative    Card #3 Date 06/30/2015      Card #3 Fecal Occult Blood, POC Negative       Assessment & Plan:   Problem List Items Addressed This Visit      Respiratory   Sleep apnea    Witnessed sleep apnea; strong suspicion that this precipitated the event; consult with Dr. Nicholos Johns; explained this can be dangerous, even fatal; will try to see if emergency CPAP with auto-titration can be ordered (asking sleep specialist today)      Relevant Orders   Ambulatory referral to Pulmonology     Other   Left inguinal hernia    Refer to surgeon; explained risk of incarceration, reasons to go to the ER      Relevant Orders   Ambulatory referral to General Surgery   Abnormal EKG    No priors for comparison; low voltage in the inferior leads (lead placement?); no pause, normal intervals; will get holter and echo; to ER, call 911 if episode occurs again      Relevant Orders   Holter monitor - 24 hour   ECHOCARDIOGRAM COMPLETE   CBC with Differential/Platelet   COMPLETE METABOLIC PANEL WITH GFR   Troponin I      Follow up plan: No Follow-up on file.  An after-visit summary was printed and given to the patient at check-out.  Please see the patient instructions which may contain other information and recommendations beyond what is mentioned above in the assessment and plan.  No orders of the defined types were placed in this encounter.   Orders Placed This Encounter  Procedures  . CBC with Differential/Platelet  . COMPLETE METABOLIC PANEL WITH GFR  . Troponin I  . Ambulatory referral to General Surgery  . Ambulatory referral to Pulmonology  . Holter monitor - 24 hour  . ECHOCARDIOGRAM COMPLETE

## 2016-05-07 LAB — TROPONIN I: Troponin I: <0.01 ng/mL (ref <=0.05)

## 2016-05-10 ENCOUNTER — Ambulatory Visit (INDEPENDENT_AMBULATORY_CARE_PROVIDER_SITE_OTHER): Payer: Medicare PPO | Admitting: General Surgery

## 2016-05-10 ENCOUNTER — Other Ambulatory Visit: Payer: Self-pay

## 2016-05-10 ENCOUNTER — Encounter: Payer: Self-pay | Admitting: General Surgery

## 2016-05-10 VITALS — BP 112/58 | HR 78 | Resp 12 | Ht 69.0 in | Wt 158.0 lb

## 2016-05-10 DIAGNOSIS — K4090 Unilateral inguinal hernia, without obstruction or gangrene, not specified as recurrent: Secondary | ICD-10-CM | POA: Diagnosis not present

## 2016-05-10 DIAGNOSIS — E875 Hyperkalemia: Secondary | ICD-10-CM

## 2016-05-10 LAB — POTASSIUM: POTASSIUM: 5.8 mmol/L — AB (ref 3.5–5.3)

## 2016-05-10 NOTE — Patient Instructions (Addendum)
The patient is aware to call back for any questions or concerns.  Inguinal Hernia, Adult An inguinal hernia is when fat or the intestines push through the area where the leg meets the lower belly (groin) and make a rounded lump (bulge). This condition happens over time. There are three types of inguinal hernias. These types include:  Hernias that can be pushed back into the belly (are reducible).  Hernias that cannot be pushed back into the belly (are incarcerated).  Hernias that cannot be pushed back into the belly and lose their blood supply (get strangulated). This type needs emergency surgery. Follow these instructions at home: Lifestyle   Drink enough fluid to keep your urine (pee) clear or pale yellow.  Eat plenty of fruits, vegetables, and whole grains. These have a lot of fiber. Talk with your doctor if you have questions.  Avoid lifting heavy objects.  Avoid standing for long periods of time.  Do not use tobacco products. These include cigarettes, chewing tobacco, or e-cigarettes. If you need help quitting, ask your doctor.  Try to stay at a healthy weight. General instructions   Do not try to force the hernia back in.  Watch your hernia for any changes in color or size. Let your doctor know if there are any changes.  Take over-the-counter and prescription medicines only as told by your doctor.  Keep all follow-up visits as told by your doctor. This is important. Contact a doctor if:  You have a fever.  You have new symptoms.  Your symptoms get worse. Get help right away if:  The area where the legs meets the lower belly has:  Pain that gets worse suddenly.  A bulge that gets bigger suddenly and does not go down.  A bulge that turns red or purple.  A bulge that is painful to the touch.  You are a man and your scrotum:  Suddenly feels painful.  Suddenly changes in size.  You feel sick to your stomach (nauseous) and this feeling does not go  away.  You throw up (vomit) and this keeps happening.  You feel your heart beating a lot more quickly than normal.  You cannot poop (have a bowel movement) or pass gas. This information is not intended to replace advice given to you by your health care provider. Make sure you discuss any questions you have with your health care provider. Document Released: 03/17/2006 Document Revised: 07/23/2015 Document Reviewed: 12/25/2013 Elsevier Interactive Patient Education  2017 Elsevier Inc.  

## 2016-05-10 NOTE — Progress Notes (Signed)
Patient ID: Henry Lane, male   DOB: Nov 06, 1940, 76 y.o.   MRN: [id number]  Chief Complaint  Patient presents with  . Hernia    HPI Henry KlinefelterJames B Lane is a 76 y.o. male.  Patient is here today for an evaluation of an inguinal hernia. He states he has a left groin knot. He states that he noticed it about 5-6 years ago.  It does not seem to be causing any abdominal pain.  He has a history of right inguinal hernia repair done over 10 years ago. No nausea, vomiting, constipation or diarrhea noted. I have reviewed the history of present illness with the patient.   HPI  Past Medical History:  Diagnosis Date  . Benign enlargement of prostate   . Calcium pyrophosphate crystal disease   . Hyperlipidemia   . Left inguinal hernia 05/06/2016  . Renal disorder     Past Surgical History:  Procedure Laterality Date  . CATARACT EXTRACTION  2016  . COLONOSCOPY    . HERNIA REPAIR     right inguinal    Family History  Problem Relation Age of Onset  . Cancer Father     lung  . Colon cancer Neg Hx     Social History Social History  Substance Use Topics  . Smoking status: Never Smoker  . Smokeless tobacco: Never Used  . Alcohol use Yes     Comment: on occasion    No Known Allergies  Current Outpatient Prescriptions  Medication Sig Dispense Refill  . aspirin 81 MG tablet Take 81 mg by mouth daily.    . cetirizine (ZYRTEC) 10 MG tablet Take 10 mg by mouth daily.    . finasteride (PROSCAR) 5 MG tablet Take 1 tablet (5 mg total) by mouth daily. 90 tablet 3  . lovastatin (MEVACOR) 20 MG tablet Take 1 tablet (20 mg total) by mouth at bedtime. 90 tablet 3  . Multiple Vitamin (MULTIVITAMIN) tablet Take 1 tablet by mouth daily.    . tamsulosin (FLOMAX) 0.4 MG CAPS capsule Take 1 capsule (0.4 mg total) by mouth daily. 90 capsule 3   No current facility-administered medications for this visit.     Review of Systems Review of Systems  Constitutional: Negative.   Respiratory: Negative.    Cardiovascular: Negative.     Blood pressure (!) 112/58, pulse 78, resp. rate 12, height 5\' 9"  (1.753 m), weight 158 lb (71.7 kg).  Physical Exam Physical Exam  Constitutional: He is oriented to person, place, and time. He appears well-developed and well-nourished.  HENT:  Mouth/Throat: Oropharynx is clear and moist.  Eyes: Conjunctivae are normal. No scleral icterus.  Neck: Neck supple.  Cardiovascular: Normal rate, regular rhythm and normal heart sounds.   Pulmonary/Chest: Effort normal and breath sounds normal.  Abdominal: Soft. Normal appearance and bowel sounds are normal. There is no tenderness. A hernia is present. Hernia confirmed positive in the left inguinal area.  Large, reducible left inguinal hernia present  Lymphadenopathy:    He has no cervical adenopathy.  Neurological: He is alert and oriented to person, place, and time.  Skin: Skin is warm and dry.  Psychiatric: His behavior is normal.    Data Reviewed  None  Assessment    Large, reducible left inguinal hernia. Recent episode of dizziness, pallor sweating at night. PCP investigating this with sleep study and cardiac eaval    Plan    Surgery recommended. Procedure, risks/benfits explained.  Patient to follow-up with cardiology 05/11/16 and pulmonology on 05/24/16  as scheduled  If cleared to undergo surgery will schedule accordingly Return in 3 weeks for scheduling of inguinal hernia repair.       This information has been scribed by Dorathy Daft RN, BSN,BC.  SANKAR,SEEPLAPUTHUR G 05/10/2016, 3:38 PM

## 2016-05-11 ENCOUNTER — Ambulatory Visit
Admission: RE | Admit: 2016-05-11 | Discharge: 2016-05-11 | Disposition: A | Discharge status: Home / Self Care | Payer: Medicare PPO | Source: Ambulatory Visit | Attending: Family Medicine | Admitting: Family Medicine

## 2016-05-11 ENCOUNTER — Telehealth: Payer: Self-pay | Admitting: Family Medicine

## 2016-05-11 DIAGNOSIS — N289 Disorder of kidney and ureter, unspecified: Secondary | ICD-10-CM | POA: Diagnosis present

## 2016-05-11 DIAGNOSIS — G4730 Sleep apnea, unspecified: Secondary | ICD-10-CM | POA: Insufficient documentation

## 2016-05-11 DIAGNOSIS — I340 Nonrheumatic mitral (valve) insufficiency: Secondary | ICD-10-CM | POA: Diagnosis not present

## 2016-05-11 DIAGNOSIS — R9431 Abnormal electrocardiogram [ECG] [EKG]: Secondary | ICD-10-CM

## 2016-05-11 DIAGNOSIS — I517 Cardiomegaly: Secondary | ICD-10-CM | POA: Diagnosis not present

## 2016-05-11 DIAGNOSIS — E785 Hyperlipidemia, unspecified: Secondary | ICD-10-CM | POA: Insufficient documentation

## 2016-05-11 DIAGNOSIS — E875 Hyperkalemia: Secondary | ICD-10-CM

## 2016-05-11 MED ORDER — FUROSEMIDE 20 MG PO TABS: 10.0000 mg | ORAL_TABLET | Freq: Every day | ORAL | 0 refills | Status: DC

## 2016-05-11 NOTE — Progress Notes (Signed)
*  PRELIMINARY RESULTS* Echocardiogram 2D Echocardiogram has been performed.  Cristela BlueHege, Adilson Grafton 05/11/2016, 11:56 AM

## 2016-05-11 NOTE — Telephone Encounter (Signed)
I spoke with patient's wife They had the echocardiogram and holter monitor  We reviewed the echo report; no worries His potassium is high; his wife says he eats a ton of bananas and blueberries He also takes tart cherry juice cut out potassium-rich foods for a little while Avoid salt substitutes Taking vitamin D, baby aspirin, finesteride, tamsulosin, tart cherry juice Stop the tart cherry juice too He sees urologist, but no kidney disease Will use really low dose furosemide Thursday am and Friday am to help bring K+ level down, hopefully without dropping BP down; encouraged hydration, explained to wife Early Friday afternoon, run up to the hospital for labs Discussed case with colleague as well

## 2016-05-12 ENCOUNTER — Other Ambulatory Visit: Payer: Self-pay | Admitting: Family Medicine

## 2016-05-12 ENCOUNTER — Encounter: Payer: Self-pay | Admitting: Internal Medicine

## 2016-05-12 ENCOUNTER — Other Ambulatory Visit
Admission: RE | Admit: 2016-05-12 | Discharge: 2016-05-12 | Disposition: A | Discharge status: Home / Self Care | Payer: Medicare PPO | Source: Ambulatory Visit | Attending: Family Medicine | Admitting: Family Medicine

## 2016-05-12 DIAGNOSIS — G4719 Other hypersomnia: Secondary | ICD-10-CM

## 2016-05-12 DIAGNOSIS — E875 Hyperkalemia: Secondary | ICD-10-CM

## 2016-05-12 LAB — BASIC METABOLIC PANEL
Anion gap: 8 (ref 5–15)
BUN: 20 mg/dL (ref 6–20)
CALCIUM: 9.4 mg/dL (ref 8.9–10.3)
CO2: 29 mmol/L (ref 22–32)
CREATININE: 1.16 mg/dL (ref 0.61–1.24)
Chloride: 102 mmol/L (ref 101–111)
GFR calc Af Amer: >60 mL/min (ref >60)
GFR, EST NON AFRICAN AMERICAN: 59 mL/min — AB (ref >60)
Glucose, Bld: 120 mg/dL — ABNORMAL HIGH (ref 65–99)
Potassium: 4.4 mmol/L (ref 3.5–5.1)
SODIUM: 139 mmol/L (ref 135–145)

## 2016-05-12 LAB — MAGNESIUM: MAGNESIUM: 2.1 mg/dL (ref 1.7–2.4)

## 2016-05-12 NOTE — Assessment & Plan Note (Signed)
Recheck one more time in 1 week

## 2016-05-12 NOTE — Progress Notes (Signed)
Check labs here next week

## 2016-05-13 ENCOUNTER — Telehealth: Payer: Self-pay | Admitting: Internal Medicine

## 2016-05-13 ENCOUNTER — Telehealth: Payer: Self-pay

## 2016-05-13 ENCOUNTER — Telehealth: Payer: Self-pay | Admitting: Family Medicine

## 2016-05-13 DIAGNOSIS — G4733 Obstructive sleep apnea (adult) (pediatric): Secondary | ICD-10-CM

## 2016-05-13 NOTE — Telephone Encounter (Signed)
Called Pulmonology earlier this am got number for pager of on call doctor to see about reading sleep study asap.  I have paged twice with no return call.  Later this afternoon I called the [place] office back asked if I had correct pager number and to let them know I have not received a call back yet after 2 pages.  They told me they would get in contact with a doctor and have them call us back.  It is now 5 o'clock and we have still had no call back.  I have also got in contact with specialty services about his Holter result reading they were going to contact Dr. Roma SchanzParachose about reading his Holter ASAP as well.

## 2016-05-13 NOTE — Telephone Encounter (Signed)
Called spoke with wife he returned the Holter yesterday so it would not of captured episode.  I did call pulmonology, no doctor is at the clinic today so they gave me pager # and I have paged doctor on call.

## 2016-05-13 NOTE — Telephone Encounter (Signed)
-----   Message from Clemmie KrillLatisha A [place], [place] sent at 05/13/2016  4:44 PM EDT ----- Just got a call from [place] with [facility] stating that this patient will not be read until Monday, but as soon as it does they will give you a call.

## 2016-05-13 NOTE — Telephone Encounter (Signed)
I have contacted Dr. Marlise EvesLada's office and made them aware that we do not have any of our staff in [place] today. Dr. Sherene SiresWert can't address this message as he is not board certified in sleep. This message will need to be addressed by [facility] staff and MDs on 05/16/16.

## 2016-05-13 NOTE — Telephone Encounter (Signed)
Dr Sherie DonLada calling asking if one of the providers can please call her back She needs someone to please read the sleep study on patient Please advise

## 2016-05-13 NOTE — Telephone Encounter (Signed)
Please see if pulmonologist can interpret what happened on the sleep study If he had cardiac pause, then we need him to see cardiologist If it was sleep apnea, then he needs to be wearing the maching I really wish they had called 911 last night and gone to get checked out Contact pulm office first and I'll talk to them if needed Second, he should be wearing heart monitor, so see if they detected anything

## 2016-05-13 NOTE — Telephone Encounter (Signed)
This message will need to be addressed by the [facility] staff and MDs on 05/16/16.

## 2016-05-13 NOTE — Telephone Encounter (Signed)
See other phone note Thank you

## 2016-05-13 NOTE — Telephone Encounter (Signed)
Patient and wife concerned had another episode last night while wearing the sleep study machine hopefully it will show something.  They did not call EMS or go to ER.  He states is better this morning, denies any chest pain, SOB or symptoms.  They returned machine and they stated it could take up to 2 weeks for the results.  Please advise is there anything else we need to have them do?

## 2016-05-13 NOTE — Telephone Encounter (Signed)
I really appreciate your calls and hard work, Henry MuirJamie I talked to the patient's wife, explained we have tried to get holter results, tried to get sleep study results (he was hooked up during this most recent episode), but to no avail If any cardiac pauses, then we need to be concerned that it's his heart and he may need a pacemaker; if significant sleep apnea, he needs CPAP or BiPAP; I just don't have those answers on a Friday afternoon Patient says he's been out in the yard, looks great We discussed seriousness, may be life-threatening, just dont' know as we're working this up; discussed options; she agrees to call 911 if he has an further episodes We'll contact them at the first result

## 2016-05-13 NOTE — Telephone Encounter (Signed)
Can you call the holter device company and ask them to do a stat read on the information? I need to know if he was having any pauses or dysrrhythmias I have not heard back from pulmonologist so please page again Thank you

## 2016-05-16 ENCOUNTER — Encounter: Payer: Self-pay | Admitting: Family Medicine

## 2016-05-16 DIAGNOSIS — G4733 Obstructive sleep apnea (adult) (pediatric): Secondary | ICD-10-CM | POA: Diagnosis not present

## 2016-05-17 ENCOUNTER — Ambulatory Visit
Admission: RE | Admit: 2016-05-17 | Discharge: 2016-05-17 | Disposition: A | Discharge status: Home / Self Care | Payer: Medicare PPO | Source: Ambulatory Visit | Attending: Family Medicine | Admitting: Family Medicine

## 2016-05-17 DIAGNOSIS — R9431 Abnormal electrocardiogram [ECG] [EKG]: Secondary | ICD-10-CM | POA: Insufficient documentation

## 2016-05-17 DIAGNOSIS — I517 Cardiomegaly: Secondary | ICD-10-CM | POA: Insufficient documentation

## 2016-05-17 DIAGNOSIS — I340 Nonrheumatic mitral (valve) insufficiency: Secondary | ICD-10-CM | POA: Insufficient documentation

## 2016-05-17 NOTE — Telephone Encounter (Signed)
Received results from DR. Will call pt and inform Dr. Marlise EvesLada's office of the results.

## 2016-05-18 ENCOUNTER — Telehealth: Payer: Self-pay | Admitting: Family Medicine

## 2016-05-18 NOTE — Telephone Encounter (Signed)
Dr. Marlise EvesLada's office aware of results and next step for CPAP set up.

## 2016-05-18 NOTE — Telephone Encounter (Signed)
LM for Dr. Marlise EvesLada's nurse to give me a call back in regards to sleep results.  LMOVM for pt to return call.

## 2016-05-18 NOTE — Telephone Encounter (Signed)
They called stated they are setting him up for Auto cpap 5 to 20 and he follow-ups with pulmonology on the 27th

## 2016-05-18 NOTE — Telephone Encounter (Signed)
Misty from [facility] requesting return call to give test results [phone]

## 2016-05-18 NOTE — Telephone Encounter (Signed)
Wife informed of results. Order placed for auto CPAP. Nothing further needed.

## 2016-05-24 ENCOUNTER — Ambulatory Visit
Admission: RE | Admit: 2016-05-24 | Discharge: 2016-05-24 | Disposition: A | Discharge status: Home / Self Care | Payer: Medicare PPO | Source: Ambulatory Visit | Attending: Internal Medicine | Admitting: Internal Medicine

## 2016-05-24 ENCOUNTER — Encounter: Payer: Self-pay | Admitting: Internal Medicine

## 2016-05-24 ENCOUNTER — Ambulatory Visit (INDEPENDENT_AMBULATORY_CARE_PROVIDER_SITE_OTHER): Payer: Medicare PPO | Admitting: Internal Medicine

## 2016-05-24 VITALS — BP 126/70 | HR 65 | Wt 157.0 lb

## 2016-05-24 DIAGNOSIS — R0600 Dyspnea, unspecified: Secondary | ICD-10-CM | POA: Diagnosis not present

## 2016-05-24 DIAGNOSIS — G4733 Obstructive sleep apnea (adult) (pediatric): Secondary | ICD-10-CM | POA: Diagnosis not present

## 2016-05-24 NOTE — Progress Notes (Signed)
[facility] Consultation      Assessment and Plan:  Dyspnea/gasping. -Patient is noted to have occasional gasping/choking episodes at night that wake him up from sleep. Discussed that this may be secondary to objective sleep apnea, we will start treatment for sleep apnea and monitor his progress. These have not improved, then we'll need to consider further workup. Echo cardiogram was negative.  Obstructive sleep apnea. -HST on 05/15/16-OSA with an AHI of 35. Starting on auto CPAP.  Basal crackles in lung? Possible pulmonary edema vs. Interstitial lung disese.  -Bibasilar lung crackles noted on auscultation. This may be significant for pulmonary edema, though echocardiogram is normal. -We'll check a two-view chest x-ray    Date: 05/24/2016  MRN# [id number] Henry Lane April 13, 1940  Referring Physician: Dr. Olam Idler is a 76 y.o. old male seen in consultation for chief complaint of:    Chief Complaint  Patient presents with  . Advice Only    sleep per Lada: snores, apnea    HPI:   Patient is a 76 year old male, who is noted to have symptoms of excessive daytime sleepiness, symptoms consistent with obstructive sleep apnea. Due to his excessive daytime sleepiness symptoms, the patient was sent straight away for a home sleep study. Patient is noted to go to bed between 10 and 11 PM. He falls asleep quickly, within 5 minutes. Wakes up around 7 AM. His Epworth score is elevated at 12.During the sleep study, patient's wife apparently noted that he had another episode of gasping. He has not had another such spell since that time.   **Review of the sleep study performed on 05/12/16 showed an AHI of 35.6, consistent with severe obstructive sleep apnea. The patient was subsequent restarted on an auto titrating CPAP with pressure settings of 5-20. He has not yet been started on PAP.   **Review of Echo 05/11/16; EF=55%    PMHX:   Past Medical History:  Diagnosis  Date  . Benign enlargement of prostate   . Calcium pyrophosphate crystal disease   . Hyperlipidemia   . Left inguinal hernia 05/06/2016  . Renal disorder    Surgical Hx:  Past Surgical History:  Procedure Laterality Date  . CATARACT EXTRACTION  2016  . COLONOSCOPY    . HERNIA REPAIR     right inguinal   Family Hx:  Family History  Problem Relation Age of Onset  . Cancer Father     lung  . Colon cancer Neg Hx    Social Hx:   Social History  Substance Use Topics  . Smoking status: Never Smoker  . Smokeless tobacco: Never Used  . Alcohol use Yes     Comment: on occasion   Medication:   Reviewed.    Allergies:  Patient has no known allergies.  Review of Systems: Gen:  Denies  fever, sweats, chills HEENT: Denies blurred vision, double vision. bleeds, sore throat Cvc:  No dizziness, chest pain. Resp:   Denies cough or sputum production, shortness of breath Gi: Denies swallowing difficulty, stomach pain. Gu:  Denies bladder incontinence, burning urine Ext:   No Joint pain, stiffness. Skin: No skin rash,  hives  Endoc:  No polyuria, polydipsia. Psych: No depression, insomnia. Other:  All other systems were reviewed with the patient and were negative other that what is mentioned in the HPI.   Physical Examination:   VS: BP 126/70 (BP Location: Left Arm, Cuff Size: Normal)   Pulse 65   Wt 157 lb (  71.2 kg)   SpO2 93%   BMI 23.18 kg/m   General Appearance: No distress  Neuro:without focal findings,  speech normal,  HEENT: PERRLA, EOM intact.   Pulmonary: Bibasilar crackles, right greater than left. CardiovascularNormal S1,S2.  No m/r/g.   Abdomen: Benign, Soft, non-tender. Renal:  No costovertebral tenderness  GU:  No performed at this time. Endoc: No evident thyromegaly, no signs of acromegaly. Skin:   warm, no rashes, no ecchymosis  Extremities: normal, no cyanosis, clubbing.  Other findings:    LABORATORY PANEL:   CBC No results for input(s): WBC, HGB,  HCT, PLT in the last 168 hours. ------------------------------------------------------------------------------------------------------------------  Chemistries  No results for input(s): NA, K, CL, CO2, GLUCOSE, BUN, CREATININE, CALCIUM, MG, AST, ALT, ALKPHOS, BILITOT in the last 168 hours.  Invalid input(s): GFRCGP ------------------------------------------------------------------------------------------------------------------  Cardiac Enzymes No results for input(s): TROPONINI in the last 168 hours. ------------------------------------------------------------  RADIOLOGY:  No results found.     Thank  you for the consultation and for allowing [facility] to assist in the care of your patient. Our recommendations are noted above.  Please contact us if we can be of further service.   Wells Guileseep Codylee Patil, MD.  Board Certified in Internal Medicine, Pulmonary Medicine, Critical Care Medicine, and Sleep Medicine.  [facility] Office Number: [phone]  Santiago Gladavid Kasa, M.D.  Billy Fischeravid Simonds, M.D  05/24/2016

## 2016-05-24 NOTE — Patient Instructions (Signed)
Start Auto-CPAP range of 5-20.   Will check CXR.

## 2016-06-07 ENCOUNTER — Ambulatory Visit (INDEPENDENT_AMBULATORY_CARE_PROVIDER_SITE_OTHER): Payer: Medicare PPO | Admitting: General Surgery

## 2016-06-07 ENCOUNTER — Encounter: Payer: Self-pay | Admitting: General Surgery

## 2016-06-07 ENCOUNTER — Telehealth: Payer: Self-pay

## 2016-06-07 ENCOUNTER — Telehealth: Payer: Self-pay | Admitting: Family Medicine

## 2016-06-07 VITALS — BP 122/72 | HR 72 | Resp 13 | Ht 69.0 in | Wt 154.0 lb

## 2016-06-07 DIAGNOSIS — K4090 Unilateral inguinal hernia, without obstruction or gangrene, not specified as recurrent: Secondary | ICD-10-CM | POA: Diagnosis not present

## 2016-06-07 NOTE — Telephone Encounter (Signed)
Nurse with Dr. Marlise Eves office would like holter monitor result. Please call.

## 2016-06-07 NOTE — Telephone Encounter (Signed)
Called [facility] and spoke with [place]. She states she will message the nurse to call me back.

## 2016-06-07 NOTE — Patient Instructions (Addendum)
Inguinal Hernia, Adult An inguinal hernia is when fat or the intestines push through the area where the leg meets the lower abdomen (groin) and create a rounded lump (bulge). This condition develops over time. There are three types of inguinal hernias. These types include:  Hernias that can be pushed back into the belly (are reducible).  Hernias that are not reducible (are incarcerated).  Hernias that are not reducible and lose their blood supply (are strangulated). This type of hernia requires emergency surgery. What are the causes? This condition is caused by having a weak spot in the muscles or tissue. This weakness lets the hernia poke through. This condition can be triggered by:  Suddenly straining the muscles of the lower abdomen.  Lifting heavy objects.  Straining to have a bowel movement. Difficult bowel movements (constipation) can lead to this.  Coughing. What increases the risk? This condition is more likely to develop in:  Men.  Pregnant women.  People who:  Are overweight.  Work in jobs that require long periods of standing or heavy lifting.  Have had an inguinal hernia before.  Smoke or have lung disease. These factors can lead to long-lasting (chronic) coughing. What are the signs or symptoms? Symptoms can depend on the size of the hernia. Often, a small inguinal hernia has no symptoms. Symptoms of a larger hernia include:  A lump in the groin. This is easier to see when the person is standing. It might not be visible when he or she is lying down.  Pain or burning in the groin. This occurs especially when lifting, straining, or coughing.  A dull ache or a feeling of pressure in the groin.  A lump in the scrotum in men. Symptoms of a strangulated inguinal hernia can include:  A bulge in the groin that is very painful and tender to the touch.  A bulge that turns red or purple.  Fever, nausea, and vomiting.  The inability to have a bowel movement or to  pass gas. How is this diagnosed? This condition is diagnosed with a medical history and physical exam. Your health care provider may feel your groin area and ask you to cough. How is this treated? Treatment for this condition varies depending on the size of your hernia and whether you have symptoms. If you do not have symptoms, your health care provider may have you watch your hernia carefully and come in for follow-up visits. If your hernia is larger or if you have symptoms, your treatment will include surgery. Follow these instructions at home: Lifestyle   Drink enough fluid to keep your urine clear or pale yellow.  Eat a diet that includes a lot of fiber. Eat plenty of fruits, vegetables, and whole grains. Talk with your health care provider if you have questions.  Avoid lifting heavy objects.  Avoid standing for long periods of time.  Do not use tobacco products, including cigarettes, chewing tobacco, or e-cigarettes. If you need help quitting, ask your health care provider.  Maintain a healthy weight. General instructions   Do not try to force the hernia back in.  Watch your hernia for any changes in color or size. Let your health care provider know if any changes occur.  Take over-the-counter and prescription medicines only as told by your health care provider.  Keep all follow-up visits as told by your health care provider. This is important. Contact a health care provider if:  You have a fever.  You have new symptoms.  Your   symptoms get worse. Get help right away if:  You have pain in the groin that suddenly gets worse.  A bulge in the groin gets bigger suddenly and does not go down.  You are a man and you have a sudden pain in the scrotum, or the size of your scrotum suddenly changes.  A bulge in the groin area becomes red or purple and is painful to the touch.  You have nausea or vomiting that does not go away.  You feel your heart beating a lot more quickly  than normal.  You cannot have a bowel movement or pass gas. This information is not intended to replace advice given to you by your health care provider. Make sure you discuss any questions you have with your health care provider. Document Released: 07/03/2008 Document Revised: 07/23/2015 Document Reviewed: 12/25/2013 Elsevier Interactive Patient Education  2017 [employer].  The patient is scheduled for surgery at [facility] on 06/28/16, he will pre admit by phone. The patient is aware of date and instructions.

## 2016-06-07 NOTE — Telephone Encounter (Signed)
Thank you :)

## 2016-06-07 NOTE — Progress Notes (Addendum)
Patient ID: Henry Lane, male   DOB: 1940/09/11, 76 y.o.   MRN: [id number]  Chief Complaint  Patient presents with  . Follow-up    HPI Henry Lane is a 76 y.o. male.  Here today for follow up left inguinal hernia. He was evaluated here in 04/2016 and surgery was recommended, but at the time he was experiencing episodes of dizziness. He has since had a sleep study and cardiac evaluation.  Denies abdominal pain, nausea, vomiting, diarrhea.  No new issues. He is using his CPAP.  HPI  Past Medical History:  Diagnosis Date  . Benign enlargement of prostate   . Calcium pyrophosphate crystal disease   . Hyperlipidemia   . Left inguinal hernia 05/06/2016  . Renal disorder   . Sleep apnea    CPAP    Past Surgical History:  Procedure Laterality Date  . CATARACT EXTRACTION  2016  . COLONOSCOPY    . HERNIA REPAIR     right inguinal    Family History  Problem Relation Age of Onset  . Cancer Father     lung  . Colon cancer Neg Hx     Social History Social History  Substance Use Topics  . Smoking status: Never Smoker  . Smokeless tobacco: Never Used  . Alcohol use Yes     Comment: on occasion    No Known Allergies  Current Outpatient Prescriptions  Medication Sig Dispense Refill  . aspirin 81 MG tablet Take 81 mg by mouth daily.    . cetirizine (ZYRTEC) 10 MG tablet Take 10 mg by mouth daily.    . cholecalciferol (VITAMIN D) 1000 units tablet Take 1,000 Units by mouth daily.    . finasteride (PROSCAR) 5 MG tablet Take 1 tablet (5 mg total) by mouth daily. 90 tablet 3  . lovastatin (MEVACOR) 20 MG tablet Take 1 tablet (20 mg total) by mouth at bedtime. 90 tablet 3  . Multiple Vitamin (MULTIVITAMIN) tablet Take 1 tablet by mouth daily.    . tamsulosin (FLOMAX) 0.4 MG CAPS capsule Take 1 capsule (0.4 mg total) by mouth daily. 90 capsule 3   No current facility-administered medications for this visit.     Review of Systems Review of Systems  Constitutional: Negative.    Respiratory: Negative.   Cardiovascular: Negative.     Blood pressure 122/72, pulse 72, resp. rate 13, height  (1.753 m), weight 154 lb (69.9 kg).  Physical Exam Physical Exam  Constitutional: He is oriented to person, place, and time. He appears well-developed and well-nourished.  Eyes: Conjunctivae are normal. No scleral icterus.  Neck: Neck supple.  Cardiovascular: Normal rate, regular rhythm and normal heart sounds.   Pulmonary/Chest: Effort normal and breath sounds normal.  Abdominal: Soft. Normal appearance and bowel sounds are normal. There is no hepatomegaly. There is no tenderness. A hernia is present. Hernia confirmed positive in the left inguinal area.  Neurological: He is alert and oriented to person, place, and time.  Skin: Skin is warm and dry.  Psychiatric: His behavior is normal.    Data Reviewed Prior notes Sleep study - positive for sleep apnea. Cardiac echo= EF 55 % Assessment      Left inguinal hernia. No further dizzy spells. Based on good EF of heart, and pt now on CPAP  It is safe to proceed with planned repair of left inguinal hernia. Surgery can be accomplished with monitored anesthesia care and local anesthetic.   Plan   Hernia precautions and incarceration  were discussed with the patient. If they develop symptoms of an incarcerated hernia, they were encouraged to seek prompt medical attention.  I have recommended repair of the hernia using mesh on an outpatient basis in the near future. The risk of infection was reviewed. The role of prosthetic mesh to minimize the risk of recurrence was reviewed.  HPI, Physical Exam, Assessment and Plan have been scribed under the direction and in the presence of Kathreen Cosier, MD  Ples Specter, CMA       This information has been scribed by Dorathy Daft RN, BSN,BC.  The patient is scheduled for surgery at [facility] on 06/28/16, he will pre admit by phone. The patient is aware of date and instructions.    Documented by Sinda Du LPN  I have completed the exam and reviewed the above documentation for accuracy and completeness.  I agree with the above.  Museum/gallery conservator has been used and any errors in dictation or transcription are unintentional.  Chava Dulac G. Evette Cristal, M.D., F.A.C.S. Gerlene Burdock G 06/07/2016, 4:35 PM

## 2016-06-07 NOTE — Telephone Encounter (Signed)
Track down Holter monitor please Dr. Evette Cristal wants to fix his hernia, but we need to know what his Holter showed

## 2016-06-07 NOTE — Telephone Encounter (Signed)
Tried returning call. Office already closed.  It appears the monitor was placed on patient at [facility] by Enid Derry and they would need to call there for the results at [phone].

## 2016-06-08 ENCOUNTER — Telehealth: Payer: Self-pay | Admitting: Family Medicine

## 2016-06-08 NOTE — Telephone Encounter (Signed)
Spoke with employee at Dr Marlise Eves office. Dr Marlise Eves nurse not available.  Left message to let them know that it appears the monitor was placed at [facility] and to there for the monitor results. Number was provided and she verbalized understanding and will let them know.

## 2016-06-08 NOTE — Telephone Encounter (Signed)
Attempted to call office again. They are currently closed.

## 2016-06-08 NOTE — Telephone Encounter (Signed)
Regarding the Holter Monitor the best number to call is [phone]

## 2016-06-09 NOTE — Telephone Encounter (Signed)
Spoke with stacy at [facility]. She states she is at the department where they monitor the Holter. She will try to see if she can pull it up the results and fax them over to me.

## 2016-06-10 NOTE — Telephone Encounter (Signed)
Spoke with stacy and she states no one read his Holter results. She will send it to the Dr on call who is Dr. Charlyne Petrin and once its read she will fax over the results.

## 2016-06-10 NOTE — Telephone Encounter (Signed)
I just received it, thank you Please let pt/wife know nothing scary on the holter report Thank you

## 2016-06-13 NOTE — Telephone Encounter (Signed)
Spoke with pt wife and went over pt results ; confirmed pt gave [place] consent to release information to his wife.

## 2016-06-21 ENCOUNTER — Other Ambulatory Visit: Payer: Medicare PPO

## 2016-06-21 ENCOUNTER — Inpatient Hospital Stay
Admission: RE | Admit: 2016-06-21 | Discharge: 2016-06-21 | Disposition: A | Discharge status: Home / Self Care | Payer: Medicare PPO | Source: Ambulatory Visit

## 2016-06-21 NOTE — Pre-Procedure Instructions (Signed)
DMONTE MAHER  ECHO COMPLETE WO IMAGE ENHANCING AGENT  Order# [id number]  Reading physician: Marcina Millard, MD Ordering physician: Kerman Passey, MD Study date: 05/11/16  Study Result   Result status: Final result                   *[facility]*                       [address]                        [place], [place] [address]                            [phone]  ------------------------------------------------------------------- Transthoracic Echocardiography  Patient:    Henry Lane, Henry Lane MR #:       [id number] Study Date: 05/11/2016 Gender:     M Age:        4 Height:     175.3 cm Weight:     71.7 kg BSA:        1.87 m^2 Pt. Status: Room:   ATTENDING    Ardelia Mems, Melinda P  REFERRING    Baruch Gouty P  SONOGRAPHER  Dorene Sorrow Hege RDCS  PERFORMING   [facility]  cc:  ------------------------------------------------------------------- LV EF: 55% -   65%  ------------------------------------------------------------------- Indications:      Episode of presyncope lasting 5-10 minutes- woke from sleep- rule out r/o pauses, AVN dysfunction, heart block.Marland Kitchen   ------------------------------------------------------------------- Study Conclusions  - Left ventricle: The cavity size was normal. There was mild   concentric hypertrophy. Systolic function was normal. The   estimated ejection fraction was in the range of 55% to 65%. - Aortic valve: Valve area (Vmax): 2.59 cm^2. - Mitral valve: There was mild regurgitation.  ------------------------------------------------------------------- Study data:   Study status:  Routine.  Procedure:  The patient reported no pain pre or post test. Transthoracic echocardiography. Image quality was adequate.          Transthoracic echocardiography.  M-mode, complete 2D, spectral Doppler, and color Doppler.  Birthdate:  Patient birthdate: 1940-08-16.  Age:  Patient is 76  yr old.  Sex:  Gender: male.    BMI: 23.3 kg/m^2.  Blood pressure:     112/58  Patient status:  Outpatient.  Study date: Study date: 05/11/2016. Study time: 09:52 AM.  Location:  Echo laboratory.  -------------------------------------------------------------------  ------------------------------------------------------------------- Left ventricle:  The cavity size was normal. There was mild concentric hypertrophy. Systolic function was normal. The estimated ejection fraction was in the range of 55% to 65%.  ------------------------------------------------------------------- Aortic valve:   Structurally normal valve.   Cusp separation was normal.  Doppler:  Transvalvular velocity was within the normal range. There was no stenosis. There was no regurgitation.    Peak velocity ratio of LVOT to aortic valve: 0.82. Valve area (Vmax): 2.59 cm^2. Indexed valve area (Vmax): 1.38 cm^2/m^2.    Peak gradient (S): 4 mm Hg.  ------------------------------------------------------------------- Aorta:  The aorta was normal, not dilated, and non-diseased.  ------------------------------------------------------------------- Mitral valve:   Doppler:  There was mild regurgitation.  ------------------------------------------------------------------- Left atrium:  The atrium was at the upper limits of normal in size.   ------------------------------------------------------------------- Tricuspid valve:   Doppler:  There was mild regurgitation.  ------------------------------------------------------------------- Right atrium:  The atrium was at the upper limits of normal  in size.  ------------------------------------------------------------------- Pericardium:  The pericardium was normal in appearance. There was no pericardial effusion.  ------------------------------------------------------------------- Post procedure conclusions Ascending Aorta:  - The aorta was normal, not dilated,  and non-diseased.  ------------------------------------------------------------------- Measurements   Left ventricle                           Value          Reference  LV ID, ED, PLAX chordal          (L)     39.2  mm       43 - 52  LV ID, ES, PLAX chordal                  26.2  mm       23 - 38  LV fx shortening, PLAX chordal           33    %        >=29  LV PW thickness, ED                      10.9  mm       ----------  IVS/LV PW ratio, ED              (H)     1.38           <=1.3  LV e&', lateral                           5.11  cm/s     ----------  LV E/e&', lateral                         10.92          ----------  LV e&', medial                            5.22  cm/s     ----------  LV E/e&', medial                          10.69          ----------  LV e&', average                           5.17  cm/s     ----------  LV E/e&', average                         10.8           ----------    Ventricular septum                       Value          Reference  IVS thickness, ED                        15    mm       ----------    LVOT                                     Value  Reference  LVOT ID, S                               20    mm       ----------  LVOT area                                3.14  cm^2     ----------  LVOT peak velocity, S                    87.3  cm/s     ----------    Aortic valve                             Value          Reference  Aortic valve peak velocity, S            106   cm/s     ----------  Aortic peak gradient, S                  4     mm Hg    ----------  Velocity ratio, peak, LVOT/AV            0.82           ----------  Aortic valve area, peak velocity         2.59  cm^2     ----------  Aortic valve area/bsa, peak              1.38  cm^2/m^2 ----------  velocity    Aorta                                    Value          Reference  Aortic root ID, ED                       30    mm       ----------    Left atrium                               Value          Reference  LA ID, A-P, ES                           37    mm       ----------  LA ID/bsa, A-P                           1.98  cm/m^2   <=2.2  LA volume, S                             58.7  ml       ----------  LA volume/bsa, S                         31.4  ml/m^2   ----------  LA volume, ES, 1-p A4C  54.4  ml       ----------  LA volume/bsa, ES, 1-p A4C               29.1  ml/m^2   ----------  LA volume, ES, 1-p A2C                   57.1  ml       ----------  LA volume/bsa, ES, 1-p A2C               30.5  ml/m^2   ----------    Mitral valve                             Value          Reference  Mitral E-wave peak velocity              55.8  cm/s     ----------  Mitral A-wave peak velocity              91.2  cm/s     ----------  Mitral deceleration time         (H)     237   ms       150 - 230  Mitral E/A ratio, peak                   0.6            ----------    Right atrium                             Value          Reference  RA ID, S-I, ES, A4C                      45.3  mm       34 - 49  RA area, ES, A4C                         14.1  cm^2     8.3 - 19.5  RA volume, ES, A/L                       35.3  ml       ----------  RA volume/bsa, ES, A/L                   18.9  ml/m^2   ----------    Right ventricle                          Value          Reference  RV ID, ED, PLAX                  (H)     38.2  mm       19 - 38  TAPSE                                    25.2  mm       ----------    Pulmonic valve  Value          Reference  Pulmonic valve peak velocity, S          67.9  cm/s     ----------  Legend: (L)  and  (H)  mark values outside specified reference range.  ------------------------------------------------------------------- Prepared and Electronically Authenticated by  Jamse Mead, MD 2018-03-14T16:38:18  PACS Images   Show images for ECHOCARDIOGRAM COMPLETE  Patient Information   Patient  Name Henry Lane, Henry Lane Sex Male DOB 01/31/1941 SSN [id number]  Reason For Exam  Priority: Routine  Dx: Abnormal EKG [R94.31 (ICD-10-CM)]  Comments: Episode of presyncope lasting 5-10 minutes, woke him from sleep; r/o pauses, AVN dysfunction, heart block; no Wednesdays  Surgical History   Surgical History   No past medical history on file.    Other Surgical History   Procedure Laterality Date Comment Source  CATARACT EXTRACTION  2016  Provider  COLONOSCOPY    Provider  HERNIA REPAIR   right inguinal Provider    Performing Technologist/Nurse   Performing Technologist/Nurse: Georgann Housekeeper Hege                    Implants     No active implants to display in this view.  Order-Level Documents:   There are no order-level documents.  Encounter-Level Documents - 05/11/2016:   Scan on 05/12/2016 1:37 PM by Provider Default, MD  Electronic signature on 05/11/2016 9:37 AM      Signed   Electronically signed by Marcina Millard, MD on 05/11/16 at 1638 EDT  Printable Result Report   Result Report   External Result Report   External Result Report

## 2016-06-22 ENCOUNTER — Inpatient Hospital Stay: Admission: RE | Admit: 2016-06-22 | Payer: Medicare PPO | Source: Ambulatory Visit

## 2016-06-23 ENCOUNTER — Inpatient Hospital Stay: Admission: RE | Admit: 2016-06-23 | Payer: Medicare PPO | Source: Ambulatory Visit

## 2016-06-23 ENCOUNTER — Telehealth: Payer: Self-pay | Admitting: *Deleted

## 2016-06-23 NOTE — Telephone Encounter (Signed)
Wife called to let us know that for about a week he has been having hernia pain that comes and goes. Instructed to lay down for 30 minutes and relax. If the pain continues to be severe and hernia does not go back in to call office for appointment per Dr Evette Cristal. She states the pain occurred after vacuuming.

## 2016-06-23 NOTE — Telephone Encounter (Signed)
Patent's wife called back and said that the patient is doing much better and is not in pain. Was very thankful for the advise.

## 2016-06-24 ENCOUNTER — Encounter
Admission: RE | Admit: 2016-06-24 | Discharge: 2016-06-24 | Disposition: A | Discharge status: Home / Self Care | Payer: Medicare PPO | Source: Ambulatory Visit | Attending: General Surgery | Admitting: General Surgery

## 2016-06-24 HISTORY — DX: Personal history of urinary calculi: Z87.442

## 2016-06-24 HISTORY — DX: Headache: R51

## 2016-06-24 HISTORY — DX: Headache, unspecified: R51.9

## 2016-06-24 NOTE — Patient Instructions (Signed)
  Your procedure is scheduled on: 06-28-16 Report to Same Day Surgery 2nd floor medical mall [facility] Entrance-take elevator on left to 2nd floor.  Check in with surgery information desk.) To find out your arrival time please call [phone] between 1PM - 3PM on 06-27-16  Remember: Instructions that are not followed completely may result in serious medical risk, up to and including death, or upon the discretion of your surgeon and anesthesiologist your surgery may need to be rescheduled.    _x___ 1. Do not eat food or drink liquids after midnight. No gum chewing or hard candies.     __x__ 2. No Alcohol for 24 hours before or after surgery.   __x__3. No Smoking for 24 prior to surgery.   ____  4. Bring all medications with you on the day of surgery if instructed.    __x__ 5. Notify your doctor if there is any change in your medical condition     (cold, fever, infections).     Do not wear jewelry, make-up, hairpins, clips or nail polish.  Do not wear lotions, powders, or perfumes. You may wear deodorant.  Do not shave 48 hours prior to surgery. Men may shave face and neck.  Do not bring valuables to the hospital.    [facility] is not responsible for any belongings or valuables.               Contacts, dentures or bridgework may not be worn into surgery.  Leave your suitcase in the car. After surgery it may be brought to your room.  For patients admitted to the hospital, discharge time is determined by your treatment team.   Patients discharged the day of surgery will not be allowed to drive home.  You will need someone to drive you home and stay with you the night of your procedure.    Please read over the following fact sheets that you were given:    ____ Take anti-hypertensive (unless it includes a diuretic), cardiac, seizure, asthma,     anti-reflux and psychiatric medicines. These include:  1. NONE  2.  3.  4.  5.  6.  ____Fleets enema or Magnesium Citrate as  directed.   _x___ Use CHG Soap or sage wipes as directed on instruction sheet   ____ Use inhalers on the day of surgery and bring to hospital day of surgery  ____ Stop Metformin and Janumet 2 days prior to surgery.    ____ Take 1/2 of usual insulin dose the night before surgery and none on the morning surgery.   _x___ Follow recommendations from Cardiologist, Pulmonologist or PCP regarding  stopping Aspirin, Coumadin, Pllavix ,Eliquis, Effient, or Pradaxa, and Pletal-STOP ASA NOW  ____Stop Anti-inflammatories such as Advil, Aleve, Ibuprofen, Motrin, Naproxen, Naprosyn, Goodies powders or aspirin products. OK to take Tylenol    ____ Stop supplements until after surgery.    _X___ Bring C-Pap to the hospital.

## 2016-06-27 MED ORDER — CEFAZOLIN SODIUM-DEXTROSE 2-4 GM/100ML-% IV SOLN
2.0000 g | INTRAVENOUS | Status: AC
  Administered 2016-06-28: 2 g via INTRAVENOUS

## 2016-06-27 NOTE — Pre-Procedure Instructions (Signed)
DG Chest 2 View (Accession [id number]) (Order [id number])  Imaging  Date: 05/24/2016 Department: [facility] DIAGNOSTIC RADIOLOGY Released By: Lincoln Brigham, NT Authorizing: Shane Crutch, MD  Exam Information   Status Exam Begun  Exam Ended   Final [99] 05/24/2016 12:44 PM 05/24/2016 12:53 PM  PACS Images   Show images for DG Chest 2 View  Study Result   CLINICAL DATA:  Dyspnea.  EXAM: CHEST  2 VIEW  COMPARISON:  None.  FINDINGS: Normal heart size and mediastinal contours. No acute infiltrate or edema. No effusion or pneumothorax. Spondylosis. No acute osseous findings.  IMPRESSION: Negative chest.   Electronically Signed   By: Marnee Spring M.D.   On: 05/24/2016 14:25   Vitals   Height Weight BMI (Calculated)   (1.753 m) 154 lb (69.9 kg) 22.8  External Result Report   External Result Report  Imaging   Imaging Information  Signed by   Signed Date/Time  Phone Pager  Audry Riles 05/24/2016 2:25 PM [phone]   Signed   Electronically signed by Audry Riles, MD on 05/24/16 at 1425 EDT  Study Notes     Pomeroy,Lindsey D, RT on 05/24/2016 12:57 PM  Patient is noted to have occasional gasping/choking episodes at night that wake him up from sleep. Discussed that this may be secondary to objective sleep apnea, we will start treatment for sleep apnea and monitor his progress.   Echo cardiogram was negative.   -Bibasilar lung crackles noted on auscultation.   Never a smoker    Original Order   Ordered On Ordered By   05/24/2016 12:23 PM Renea Ee, LPN

## 2016-06-27 NOTE — Pre-Procedure Instructions (Signed)
Progress Notes Encounter Date: 05/24/2016 Shane Crutch, MD  Pulmonology  Expand All Collapse All   Hide copied text Hover for attribution information  [facility] Pulmonary Medicine Consultation      Assessment and Plan:  Dyspnea/gasping. -Patient is noted to have occasional gasping/choking episodes at night that wake him up from sleep. Discussed that this may be secondary to objective sleep apnea, we will start treatment for sleep apnea and monitor his progress. These have not improved, then we'll need to consider further workup. Echo cardiogram was negative.  Obstructive sleep apnea. -HST on 05/15/16-OSA with an AHI of 35. Starting on auto CPAP.  Basal crackles in lung? Possible pulmonary edema vs. Interstitial lung disese.  -Bibasilar lung crackles noted on auscultation. This may be significant for pulmonary edema, though echocardiogram is normal. -We'll check a two-view chest x-ray    Date: 05/24/2016  MRN# [id number] Henry Lane [birth year]  Referring Physician: Dr. Olam Idler is a 76 y.o. old male seen in consultation for chief complaint of:        Chief Complaint  Patient presents with  . Advice Only    sleep per Lada: snores, apnea    HPI:   Patient is a 76 year old male, who is noted to have symptoms of excessive daytime sleepiness, symptoms consistent with obstructive sleep apnea. Due to his excessive daytime sleepiness symptoms, the patient was sent straight away for a home sleep study. Patient is noted to go to bed between 10 and 11 PM. He falls asleep quickly, within 5 minutes. Wakes up around 7 AM. His Epworth score is elevated at 12.During the sleep study, patient's wife apparently noted that he had another episode of gasping. He has not had another such spell since that time.   **Review of the sleep study performed on 05/12/16 showed an AHI of 35.6, consistent with severe obstructive sleep apnea. The patient was subsequent  restarted on an auto titrating CPAP with pressure settings of 5-20. He has not yet been started on PAP.   **Review of Echo 05/11/16; EF=55%    PMHX:       Past Medical History:  Diagnosis Date  . Benign enlargement of prostate   . Calcium pyrophosphate crystal disease   . Hyperlipidemia   . Left inguinal hernia 05/06/2016  . Renal disorder    Surgical Hx:       Past Surgical History:  Procedure Laterality Date  . CATARACT EXTRACTION  2016  . COLONOSCOPY    . HERNIA REPAIR     right inguinal   Family Hx:        Family History  Problem Relation Age of Onset  . Cancer Father     lung  . Colon cancer Neg Hx    Social Hx:          Social History   Substance Use Topics   . Smoking status: Never Smoker   . Smokeless tobacco: Never Used   . Alcohol use Yes     Comment: on occasion    Medication:   Reviewed.    Allergies:  Patient has no known allergies.  Review of Systems: Gen:  Denies  fever, sweats, chills HEENT: Denies blurred vision, double vision. bleeds, sore throat Cvc:  No dizziness, chest pain. Resp:   Denies cough or sputum production, shortness of breath Gi: Denies swallowing difficulty, stomach pain. Gu:  Denies bladder incontinence, burning urine Ext:   No Joint pain, stiffness. Skin: No skin rash,  hives  Endoc:  No polyuria, polydipsia. Psych: No depression, insomnia. Other:  All other systems were reviewed with the patient and were negative other that what is mentioned in the HPI.   Physical Examination:   VS: BP 126/70 (BP Location: Left Arm, Cuff Size: Normal)   Pulse 65   Wt 157 lb (71.2 kg)   SpO2 93%   BMI 23.18 kg/m   General Appearance: No distress  Neuro:without focal findings,  speech normal,  HEENT: PERRLA, EOM intact.   Pulmonary: Bibasilar crackles, right greater than left. CardiovascularNormal S1,S2.  No m/r/g.   Abdomen: Benign, Soft, non-tender. Renal:  No costovertebral tenderness  GU:  No  performed at this time. Endoc: No evident thyromegaly, no signs of acromegaly. Skin:   warm, no rashes, no ecchymosis  Extremities: normal, no cyanosis, clubbing.  Other findings:    LABORATORY PANEL:   CBC LastLabs  No results for input(s): WBC, HGB, HCT, PLT in the last 168 hours.   ------------------------------------------------------------------------------------------------------------------  Chemistries   LastLabs  No results for input(s): NA, K, CL, CO2, GLUCOSE, BUN, CREATININE, CALCIUM, MG, AST, ALT, ALKPHOS, BILITOT in the last 168 hours.  Invalid input(s): GFRCGP   ------------------------------------------------------------------------------------------------------------------  Cardiac Enzymes LastLabs  No results for input(s): TROPONINI in the last 168 hours.   ------------------------------------------------------------  RADIOLOGY:  ImagingResults(Last48hours)  No results found.       Thank  you for the consultation and for allowing [facility] to assist in the care of your patient. Our recommendations are noted above.  Please contact us if we can be of further service.   [name], MD.  Board Certified in Internal Medicine, Pulmonary Medicine, Critical Care Medicine, and Sleep Medicine.  [facility] Office Number: [phone]  Santiago Glad, M.D.  Billy Fischer, M.D  05/24/2016    Electronically signed by Shane Crutch, MD at 05/24/2016 12:24 PM      Office Visit on 05/24/2016        Detailed Report

## 2016-06-28 ENCOUNTER — Ambulatory Visit
Admission: RE | Admit: 2016-06-28 | Discharge: 2016-06-28 | Disposition: A | Discharge status: Home / Self Care | Payer: Medicare PPO | Source: Ambulatory Visit | Attending: General Surgery | Admitting: General Surgery

## 2016-06-28 ENCOUNTER — Encounter: Payer: Self-pay | Admitting: *Deleted

## 2016-06-28 ENCOUNTER — Encounter
Admission: RE | Disposition: A | Discharge status: Home / Self Care | Payer: Self-pay | Source: Ambulatory Visit | Attending: General Surgery

## 2016-06-28 ENCOUNTER — Ambulatory Visit: Payer: Medicare PPO | Admitting: Anesthesiology

## 2016-06-28 DIAGNOSIS — N2889 Other specified disorders of kidney and ureter: Secondary | ICD-10-CM | POA: Diagnosis not present

## 2016-06-28 DIAGNOSIS — E785 Hyperlipidemia, unspecified: Secondary | ICD-10-CM | POA: Diagnosis not present

## 2016-06-28 DIAGNOSIS — N400 Enlarged prostate without lower urinary tract symptoms: Secondary | ICD-10-CM | POA: Diagnosis not present

## 2016-06-28 DIAGNOSIS — Z9842 Cataract extraction status, left eye: Secondary | ICD-10-CM | POA: Insufficient documentation

## 2016-06-28 DIAGNOSIS — Z9841 Cataract extraction status, right eye: Secondary | ICD-10-CM | POA: Diagnosis not present

## 2016-06-28 DIAGNOSIS — Z7982 Long term (current) use of aspirin: Secondary | ICD-10-CM | POA: Diagnosis not present

## 2016-06-28 DIAGNOSIS — K4090 Unilateral inguinal hernia, without obstruction or gangrene, not specified as recurrent: Secondary | ICD-10-CM | POA: Insufficient documentation

## 2016-06-28 DIAGNOSIS — G4730 Sleep apnea, unspecified: Secondary | ICD-10-CM | POA: Insufficient documentation

## 2016-06-28 DIAGNOSIS — Z801 Family history of malignant neoplasm of trachea, bronchus and lung: Secondary | ICD-10-CM | POA: Insufficient documentation

## 2016-06-28 DIAGNOSIS — Z79899 Other long term (current) drug therapy: Secondary | ICD-10-CM | POA: Diagnosis not present

## 2016-06-28 HISTORY — PX: INGUINAL HERNIA REPAIR: SHX194

## 2016-06-28 SURGERY — REPAIR, HERNIA, INGUINAL, ADULT
Anesthesia: General | Laterality: Left | Wound class: Clean

## 2016-06-28 MED ORDER — FAMOTIDINE 20 MG PO TABS
20.0000 mg | ORAL_TABLET | Freq: Once | ORAL | Status: AC
  Administered 2016-06-28: 20 mg via ORAL

## 2016-06-28 MED ORDER — BUPIVACAINE HCL (PF) 0.5 % IJ SOLN
INTRAMUSCULAR | Status: AC
  Filled 2016-06-28: qty 30

## 2016-06-28 MED ORDER — LIDOCAINE HCL 1 % IJ SOLN
INTRAMUSCULAR | Status: DC | PRN
  Administered 2016-06-28: 30 mL

## 2016-06-28 MED ORDER — PROPOFOL 10 MG/ML IV BOLUS
INTRAVENOUS | Status: DC | PRN
  Administered 2016-06-28: 80 mg via INTRAVENOUS
  Administered 2016-06-28: 75 mg via INTRAVENOUS

## 2016-06-28 MED ORDER — FENTANYL CITRATE (PF) 100 MCG/2ML IJ SOLN
INTRAMUSCULAR | Status: DC | PRN
  Administered 2016-06-28: 50 ug via INTRAVENOUS

## 2016-06-28 MED ORDER — FAMOTIDINE 20 MG PO TABS
ORAL_TABLET | ORAL | Status: AC
  Administered 2016-06-28: 20 mg via ORAL
  Filled 2016-06-28: qty 1

## 2016-06-28 MED ORDER — CHLORHEXIDINE GLUCONATE CLOTH 2 % EX PADS: 6.0000 | MEDICATED_PAD | Freq: Once | CUTANEOUS | Status: DC

## 2016-06-28 MED ORDER — ONDANSETRON HCL 4 MG/2ML IJ SOLN: 4.0000 mg | Freq: Once | INTRAMUSCULAR | Status: DC | PRN

## 2016-06-28 MED ORDER — PROPOFOL 500 MG/50ML IV EMUL
INTRAVENOUS | Status: DC | PRN
  Administered 2016-06-28: 120 ug/kg/min via INTRAVENOUS

## 2016-06-28 MED ORDER — FENTANYL CITRATE (PF) 100 MCG/2ML IJ SOLN
INTRAMUSCULAR | Status: AC
  Filled 2016-06-28: qty 2

## 2016-06-28 MED ORDER — LACTATED RINGERS IV SOLN
INTRAVENOUS | Status: DC
  Administered 2016-06-28: 50 mL/h via INTRAVENOUS

## 2016-06-28 MED ORDER — CEFAZOLIN SODIUM-DEXTROSE 2-4 GM/100ML-% IV SOLN
INTRAVENOUS | Status: AC
  Administered 2016-06-28: 2 g via INTRAVENOUS
  Filled 2016-06-28: qty 100

## 2016-06-28 MED ORDER — ACETAMINOPHEN 10 MG/ML IV SOLN
INTRAVENOUS | Status: AC
  Filled 2016-06-28: qty 100

## 2016-06-28 MED ORDER — LIDOCAINE HCL (PF) 1 % IJ SOLN
INTRAMUSCULAR | Status: AC
  Filled 2016-06-28: qty 30

## 2016-06-28 MED ORDER — LIDOCAINE HCL 2 % EX GEL
CUTANEOUS | Status: AC
  Filled 2016-06-28: qty 5

## 2016-06-28 MED ORDER — FENTANYL CITRATE (PF) 100 MCG/2ML IJ SOLN: 25.0000 ug | INTRAMUSCULAR | Status: DC | PRN

## 2016-06-28 MED ORDER — OXYMETAZOLINE HCL 0.05 % NA SOLN
NASAL | Status: AC
  Filled 2016-06-28: qty 15

## 2016-06-28 MED ORDER — OXYMETAZOLINE HCL 0.05 % NA SOLN
NASAL | Status: DC | PRN
  Administered 2016-06-28: 2 via NASAL

## 2016-06-28 MED ORDER — PROPOFOL 500 MG/50ML IV EMUL
INTRAVENOUS | Status: AC
  Filled 2016-06-28: qty 50

## 2016-06-28 MED ORDER — ACETAMINOPHEN 10 MG/ML IV SOLN
INTRAVENOUS | Status: DC | PRN
  Administered 2016-06-28: 1000 mg via INTRAVENOUS

## 2016-06-28 MED ORDER — OXYCODONE-ACETAMINOPHEN 5-325 MG PO TABS: 1.0000 | ORAL_TABLET | ORAL | 0 refills | Status: DC | PRN

## 2016-06-28 SURGICAL SUPPLY — 29 items
BLADE SURG 15 STRL SS SAFETY (BLADE) ×3 IMPLANT
CANISTER SUCT 1200ML W/VALVE (MISCELLANEOUS) ×3 IMPLANT
CHLORAPREP W/TINT 26ML (MISCELLANEOUS) ×3 IMPLANT
DECANTER SPIKE VIAL GLASS SM (MISCELLANEOUS) ×6 IMPLANT
DERMABOND ADVANCED (GAUZE/BANDAGES/DRESSINGS) ×2
DERMABOND ADVANCED .7 DNX12 (GAUZE/BANDAGES/DRESSINGS) ×1 IMPLANT
DRAIN PENROSE 1/4X12 LTX (DRAIN) ×3 IMPLANT
DRAPE LAPAROTOMY 100X77 ABD (DRAPES) ×3 IMPLANT
ELECT REM PT RETURN 9FT ADLT (ELECTROSURGICAL) ×3
ELECTRODE REM PT RTRN 9FT ADLT (ELECTROSURGICAL) ×1 IMPLANT
GLOVE BIO SURGEON STRL SZ7 (GLOVE) ×3 IMPLANT
GOWN STRL REUS W/ TWL LRG LVL3 (GOWN DISPOSABLE) ×2 IMPLANT
GOWN STRL REUS W/TWL LRG LVL3 (GOWN DISPOSABLE) ×4
KIT RM TURNOVER STRD PROC AR (KITS) ×3 IMPLANT
LABEL OR SOLS (LABEL) ×3 IMPLANT
MESH PARIETEX PROGRIP LEFT (Mesh General) ×3 IMPLANT
NEEDLE HYPO 22GX1.5 SAFETY (NEEDLE) ×3 IMPLANT
NEEDLE HYPO 25X1 1.5 SAFETY (NEEDLE) ×3 IMPLANT
NS IRRIG 500ML POUR BTL (IV SOLUTION) ×3 IMPLANT
PACK BASIN MINOR ARMC (MISCELLANEOUS) ×3 IMPLANT
SUT PDS 2-0 27IN (SUTURE) ×6 IMPLANT
SUT VIC AB 2-0 SH 27 (SUTURE) ×2
SUT VIC AB 2-0 SH 27XBRD (SUTURE) ×1 IMPLANT
SUT VIC AB 3-0 54X BRD REEL (SUTURE) ×1 IMPLANT
SUT VIC AB 3-0 BRD 54 (SUTURE) ×2
SUT VIC AB 3-0 SH 27 (SUTURE) ×2
SUT VIC AB 3-0 SH 27X BRD (SUTURE) ×1 IMPLANT
SUT VIC AB 4-0 FS2 27 (SUTURE) ×3 IMPLANT
SYR CONTROL 10ML (SYRINGE) ×3 IMPLANT

## 2016-06-28 NOTE — Interval H&P Note (Signed)
History and Physical Interval Note:  06/28/2016 8:45 AM  Henry Lane  has presented today for surgery, with the diagnosis of LEFT INGUINAL HERNIA  The various methods of treatment have been discussed with the patient and family. After consideration of risks, benefits and other options for treatment, the patient has consented to  Procedure(s): HERNIA REPAIR INGUINAL ADULT (Left) as a surgical intervention .  The patient's history has been reviewed, patient examined, no change in status, stable for surgery.  I have reviewed the patient's chart and labs.  Questions were answered to the patient's satisfaction.     SANKAR,SEEPLAPUTHUR G

## 2016-06-28 NOTE — Transfer of Care (Signed)
Immediate Anesthesia Transfer of Care Note  Patient: Henry Lane  Procedure(s) Performed: Procedure(s): HERNIA REPAIR INGUINAL ADULT (Left)  Patient Location: PACU  Anesthesia Type:General  Level of Consciousness: patient cooperative and lethargic  Airway & Oxygen Therapy: Patient Spontanous Breathing and Patient connected to face mask oxygen  Post-op Assessment: Report given to RN and Post -op Vital signs reviewed and stable  Post vital signs: Reviewed and stable  Last Vitals:  Vitals:   06/28/16 0839 06/28/16 1117  BP: 129/69 102/61  Pulse: (!) 56 (!) 41  Resp: 16 14  Temp: (!) 35.9 C 36.7 C    Last Pain:  Vitals:   06/28/16 0908  TempSrc: Oral         Complications: No apparent anesthesia complications

## 2016-06-28 NOTE — Anesthesia Post-op Follow-up Note (Cosign Needed)
Anesthesia QCDR form completed.        

## 2016-06-28 NOTE — Anesthesia Postprocedure Evaluation (Signed)
Anesthesia Post Note  Patient: Henry Lane  Procedure(s) Performed: Procedure(s) (LRB): HERNIA REPAIR INGUINAL ADULT (Left)  Patient location during evaluation: PACU Anesthesia Type: General Level of consciousness: awake and alert and oriented Pain management: pain level controlled Vital Signs Assessment: post-procedure vital signs reviewed and stable Respiratory status: spontaneous breathing, nonlabored ventilation and respiratory function stable Cardiovascular status: blood pressure returned to baseline and stable Postop Assessment: no signs of nausea or vomiting Anesthetic complications: no     Last Vitals:  Vitals:   06/28/16 1132 06/28/16 1147  BP: (!) 106/55 117/64  Pulse: (!) 44 (!) 42  Resp: 18 19  Temp:      Last Pain:  Vitals:   06/28/16 1117  TempSrc:   PainSc: Asleep                 Dink Creps

## 2016-06-28 NOTE — H&P (View-Only) (Signed)
Patient ID: Henry Lane, male   DOB: 1940/05/20, 76 y.o.   MRN: [id number]  Chief Complaint  Patient presents with  . Follow-up    HPI Henry Lane is a 76 y.o. male.  Here today for follow up left inguinal hernia. He was evaluated here in 04/2016 and surgery was recommended, but at the time he was experiencing episodes of dizziness. He has since had a sleep study and cardiac evaluation.  Denies abdominal pain, nausea, vomiting, diarrhea.  No new issues. He is using his CPAP.  HPI  Past Medical History:  Diagnosis Date  . Benign enlargement of prostate   . Calcium pyrophosphate crystal disease   . Hyperlipidemia   . Left inguinal hernia 05/06/2016  . Renal disorder   . Sleep apnea    CPAP    Past Surgical History:  Procedure Laterality Date  . CATARACT EXTRACTION  2016  . COLONOSCOPY    . HERNIA REPAIR     right inguinal    Family History  Problem Relation Age of Onset  . Cancer Father     lung  . Colon cancer Neg Hx     Social History Social History  Substance Use Topics  . Smoking status: Never Smoker  . Smokeless tobacco: Never Used  . Alcohol use Yes     Comment: on occasion    No Known Allergies  Current Outpatient Prescriptions  Medication Sig Dispense Refill  . aspirin 81 MG tablet Take 81 mg by mouth daily.    . cetirizine (ZYRTEC) 10 MG tablet Take 10 mg by mouth daily.    . cholecalciferol (VITAMIN D) 1000 units tablet Take 1,000 Units by mouth daily.    . finasteride (PROSCAR) 5 MG tablet Take 1 tablet (5 mg total) by mouth daily. 90 tablet 3  . lovastatin (MEVACOR) 20 MG tablet Take 1 tablet (20 mg total) by mouth at bedtime. 90 tablet 3  . Multiple Vitamin (MULTIVITAMIN) tablet Take 1 tablet by mouth daily.    . tamsulosin (FLOMAX) 0.4 MG CAPS capsule Take 1 capsule (0.4 mg total) by mouth daily. 90 capsule 3   No current facility-administered medications for this visit.     Review of Systems Review of Systems  Constitutional: Negative.    Respiratory: Negative.   Cardiovascular: Negative.     Blood pressure 122/72, pulse 72, resp. rate 13, height  (1.753 m), weight 154 lb (69.9 kg).  Physical Exam Physical Exam  Constitutional: He is oriented to person, place, and time. He appears well-developed and well-nourished.  Eyes: Conjunctivae are normal. No scleral icterus.  Neck: Neck supple.  Cardiovascular: Normal rate, regular rhythm and normal heart sounds.   Pulmonary/Chest: Effort normal and breath sounds normal.  Abdominal: Soft. Normal appearance and bowel sounds are normal. There is no hepatomegaly. There is no tenderness. A hernia is present. Hernia confirmed positive in the left inguinal area.  Neurological: He is alert and oriented to person, place, and time.  Skin: Skin is warm and dry.  Psychiatric: His behavior is normal.    Data Reviewed Prior notes Sleep study - positive for sleep apnea. Cardiac echo= EF 55 % Assessment      Left inguinal hernia. No further dizzy spells. Based on good EF of heart, and pt now on CPAP  It is safe to proceed with planned repair of left inguinal hernia. Surgery can be accomplished with monitored anesthesia care and local anesthetic.   Plan   Hernia precautions and incarceration  were discussed with the patient. If they develop symptoms of an incarcerated hernia, they were encouraged to seek prompt medical attention.  I have recommended repair of the hernia using mesh on an outpatient basis in the near future. The risk of infection was reviewed. The role of prosthetic mesh to minimize the risk of recurrence was reviewed.  HPI, Physical Exam, Assessment and Plan have been scribed under the direction and in the presence of Kathreen Cosier, MD  Ples Specter, CMA       This information has been scribed by Dorathy Daft RN, BSN,BC.  The patient is scheduled for surgery at [facility] on 06/28/16, he will pre admit by phone. The patient is aware of date and instructions.    Documented by Sinda Du LPN  I have completed the exam and reviewed the above documentation for accuracy and completeness.  I agree with the above.  Museum/gallery conservator has been used and any errors in dictation or transcription are unintentional.  Josiah Wojtaszek G. Evette Cristal, M.D., F.A.C.S. Gerlene Burdock G 06/07/2016, 4:35 PM

## 2016-06-28 NOTE — Discharge Instructions (Signed)
Use ice pack as needed today.   AMBULATORY SURGERY  DISCHARGE INSTRUCTIONS   1) The drugs that you were given will stay in your system until tomorrow so for the next 24 hours you should not:  A) Drive an automobile B) Make any legal decisions C) Drink any alcoholic beverage   2) You may resume regular meals tomorrow.  Today it is better to start with liquids and gradually work up to solid foods.  You may eat anything you prefer, but it is better to start with liquids, then soup and crackers, and gradually work up to solid foods.   3) Please notify your doctor immediately if you have any unusual bleeding, trouble breathing, redness and pain at the surgery site, drainage, fever, or pain not relieved by medication.    4) Additional Instructions:        Please contact your physician with any problems or Same Day Surgery at [phone], Monday through Friday 6 am to 4 pm, or [place] at [facility] number at [phone].

## 2016-06-28 NOTE — Anesthesia Preprocedure Evaluation (Signed)
Anesthesia Evaluation  Patient identified by MRN, date of birth, ID band Patient awake    Reviewed: Allergy & Precautions, NPO status , Patient's Chart, lab work & pertinent test results  History of Anesthesia Complications Negative for: history of anesthetic complications  Airway Mallampati: II       Dental   Pulmonary neg pulmonary ROS,           Cardiovascular negative cardio ROS       Neuro/Psych    GI/Hepatic negative GI ROS, Neg liver ROS,   Endo/Other  negative endocrine ROS  Renal/GU Prostate enlargement     Musculoskeletal   Abdominal   Peds  Hematology negative hematology ROS (+)   Anesthesia Other Findings   Reproductive/Obstetrics                             Anesthesia Physical Anesthesia Plan  ASA: II  Anesthesia Plan: General   Post-op Pain Management:    Induction: Intravenous  Airway Management Planned: Nasal Cannula  Additional Equipment:   Intra-op Plan:   Post-operative Plan:   Informed Consent: I have reviewed the patients History and Physical, chart, labs and discussed the procedure including the risks, benefits and alternatives for the proposed anesthesia with the patient or authorized representative who has indicated his/her understanding and acceptance.     Plan Discussed with:   Anesthesia Plan Comments:         Anesthesia Quick Evaluation

## 2016-06-28 NOTE — Op Note (Signed)
Preop diagnosis: Left inguinal hernia  Post op diagnosis: Left inguinal hernia indirect sliding variety  Operation: Repair left inguinal hernia with Progip mesh  Surgeon: Kathreen Cosier  Assistant:     Anesthesia: Monitored anesthesia care using local anesthetic containing 0.5% Marcaine mixed with 1% Xylocaine total of 30 mL  Complications: None  EBL: Less than 10 mL  Drains: None  Description: Patient was placed supine on the operating table in the left groin area was clipped of hair and subsequently prepped and draped as sterile field. Timeout was performed. With adequate sedation and monitoring local anesthetic was instilled in the inguinal region to obtain an adequate block. Incision was made along the medial two thirds of the inguinal canal and deepened through the layers down the external oblique. Bleeding was controlled cautery and ligatures of 3-0 Vicryl. External oblique was opened along the line of its fibers through the external ring. The ilioinguinal nerve was located in the superior to the cord and was not involved in the dissection. The hernia was reduced and the fascia covering the cord was then opened to reveal what appeared to be a large  indirect sac. The lateral end of this appeared to be bowel suggesting that this was a sliding hernia. The hernia was then dissected free from all the surrounding cord structures and then opened and verified that the sigmoid colon was in fact the the lateral end of this hernial sac. Excess of the sac was then excised out in the peritoneal opening was closed with a running suture of 2-0 Vicryl. This was tied down and subsequently tacked to the undersurface of the internal oblique muscle. The cord was then dissected free from the posterior wall and was noted that the there was a prominent bulge through the transversalis fascia constituting the beginning of a direct hernia. This was then plicated using with 2-0 Vicryl stitch. The mesh was then brought  up and placed around the cord and laid down against the posterior wall and the lateral ends tucked underneath the external oblique. Medial and was tacked to the pubic tubercle with a single stitch of 2-0 PDS. The external oblique was then closed with a running 2-0 PDS. Subcutaneous tissue closed with 3-0 Vicryl. Skin closed with subcuticular 4-0 Vicryl covered with Dermabond. Procedure was well-tolerated with no immediate problems encountered. Patient was subsequently returned to PACU in stable condition.

## 2016-06-30 ENCOUNTER — Telehealth: Payer: Self-pay

## 2016-06-30 NOTE — Telephone Encounter (Signed)
Patient called and had inguinal hernia surgery on 06/28/16 and was concerned he had not moved his bowels yet. I let him know this was common. He may use Miralax one capful in a glass of water as directed to help with this. He has not been using the prescription pain medication, just making use of Tylenol. He is aware to call back with any further concerns.

## 2016-07-04 ENCOUNTER — Encounter: Payer: Self-pay | Admitting: Internal Medicine

## 2016-07-04 NOTE — Progress Notes (Signed)
[facility]      Assessment and Plan:   Obstructive sleep apnea. -HST on 05/15/16-OSA with an AHI of 35.  -Residual AHI is slightly elevated at 5.8; will adjust AutoSet pressure range to 10-16  Basal crackles in lung? Possible pulmonary edema vs. Interstitial lung disese.  -Bibasilar lung crackles noted on auscultation. This may be significant for pulmonary edema, though echocardiogram is normal. Review chest x-ray shows some possible interstitial changes in the bases, however he is asymptomatic; on exam today crackles have resolved, this was likely related to pulmonary edema.     Date: 07/04/2016  MRN# [id number] Henry Lane 02-20-1941  Referring Physician: Dr. Olam IdlerLada  Dewan B Lane is a 76 y.o. old male seen in consultation for chief complaint of:    Chief Complaint  Patient presents with  . Sleep Apnea    Pt here for f/u cpap. He is having hard time adjusting to mask. Pt wears nightly 6 hrs.    HPI:   Patient is a 76 year old male, who is noted to have symptoms of excessive daytime sleepiness, symptoms consistent with obstructive sleep apnea. Due to his excessive daytime sleepiness symptoms, the patient was sent straight away for a home sleep study. Patient is noted to go to bed between 10 and 11 PM. He falls asleep quickly, within 5 minutes. Wakes up around 7 AM. His Epworth score is elevated at 12.During the sleep study, patient's wife apparently noted that he had another episode of gasping. He has not had another such spell since that time.   **Review of the sleep study performed on 05/12/16 showed an AHI of 35.6, consistent with severe obstructive sleep apnea. The patient was subsequent restarted on an auto titrating CPAP with pressure settings of 5-20. **Review of download data past 30 days; usage is 100%, average usage is 6 hours 51 minutes, auto set 5-20. Residual AHI is 5.8, predominantly obstructive. 95th percentile pressure was 12.3, maximum  is 14, pressure ranges typically 10-16.  **Review of Echo 05/11/16; EF=55%  **Personally reviewed, chest x-ray. 05/24/16; hyperinflation consistent with emphysema, possible pulmonary fibrotic changes seen in the bases vs. pulm edema.   Medication:   Reviewed.    Allergies:  Patient has no known allergies.  Review of Systems: Gen:  Denies  fever, sweats, chills HEENT: Denies blurred vision, double vision. bleeds, sore throat Cvc:  No dizziness, chest pain. Resp:   Denies cough or sputum production, shortness of breath Gi: Denies swallowing difficulty, stomach pain. Gu:  Denies bladder incontinence, burning urine Ext:   No Joint pain, stiffness. Skin: No skin rash,  hives  Endoc:  No polyuria, polydipsia. Psych: No depression, insomnia. Other:  All other systems were reviewed with the patient and were negative other that what is mentioned in the HPI.   Physical Examination:   VS: There were no vitals taken for this visit.  General Appearance: No distress  Neuro:without focal findings,  speech normal,  HEENT: PERRLA, EOM intact.   Pulmonary: Bibasilar crackles, right greater than left. CardiovascularNormal S1,S2.  No m/r/g.   Abdomen: Benign, Soft, non-tender. Renal:  No costovertebral tenderness  GU:  No performed at this time. Endoc: No evident thyromegaly, no signs of acromegaly. Skin:   warm, no rashes, no ecchymosis  Extremities: normal, no cyanosis, clubbing.  Other findings:    LABORATORY PANEL:   CBC No results for input(s): WBC, HGB, HCT, PLT in the last 168 hours. ------------------------------------------------------------------------------------------------------------------  Chemistries  No results for input(s): NA,  K, CL, CO2, GLUCOSE, BUN, CREATININE, CALCIUM, MG, AST, ALT, ALKPHOS, BILITOT in the last 168 hours.  Invalid input(s):  GFRCGP ------------------------------------------------------------------------------------------------------------------  Cardiac Enzymes No results for input(s): TROPONINI in the last 168 hours. ------------------------------------------------------------  RADIOLOGY:  No results found.     Thank  you for the consultation and for allowing [facility] to assist in the care of your patient. Our recommendations are noted above.  Please contact us if we can be of further service.   [name], MD.  Board Certified in Internal Medicine, Pulmonary Medicine, Critical Care Medicine, and Sleep Medicine.  [facility] Office Number: [phone]  Santiago Glad, M.D.  Billy Fischer, M.D  07/04/2016

## 2016-07-05 ENCOUNTER — Encounter: Payer: Self-pay | Admitting: General Surgery

## 2016-07-05 ENCOUNTER — Ambulatory Visit (INDEPENDENT_AMBULATORY_CARE_PROVIDER_SITE_OTHER): Payer: Medicare PPO | Admitting: General Surgery

## 2016-07-05 VITALS — BP 124/70 | HR 70 | Resp 12 | Ht 69.0 in | Wt 152.0 lb

## 2016-07-05 DIAGNOSIS — K4090 Unilateral inguinal hernia, without obstruction or gangrene, not specified as recurrent: Secondary | ICD-10-CM

## 2016-07-05 NOTE — Patient Instructions (Addendum)
Light activity is permitted. Gradually increase yard work activity starting next week.  Follow up in 1 month. The patient is aware to call back for any questions or concerns.

## 2016-07-05 NOTE — Progress Notes (Signed)
Patient ID: Henry Lane, male   DOB: Apr 12, 1940, 75 y.o.   MRN: [id number]  Chief Complaint  Patient presents with  . Routine Post Op    HPI Henry Lane is a 76 y.o. male.  Here today for postoperative visit, inguinal hernia repair 06-28-16, he states he is doing well now that he is not constipated . Denies any gastrointestinal issues, bowels are moving regular.  HPI  Past Medical History:  Diagnosis Date  . Benign enlargement of prostate   . Calcium pyrophosphate crystal disease   . Headache    h/o migraines  . History of kidney stones   . Hyperlipidemia   . Left inguinal hernia 05/06/2016  . Renal disorder   . Sleep apnea    CPAP    Past Surgical History:  Procedure Laterality Date  . CATARACT EXTRACTION  2016  . COLONOSCOPY    . EYE SURGERY Bilateral    cataracts  . HERNIA REPAIR     right inguinal  . INGUINAL HERNIA REPAIR Left 06/28/2016   Procedure: HERNIA REPAIR INGUINAL ADULT;  Surgeon: Kieth Brightly, MD;  Location: [facility] ORS;  Service: General;  Laterality: Left;    Family History  Problem Relation Age of Onset  . Cancer Father     lung  . Colon cancer Neg Hx     Social History Social History  Substance Use Topics  . Smoking status: Never Smoker  . Smokeless tobacco: Never Used  . Alcohol use Yes     Comment: on occasion    No Known Allergies  Current Outpatient Prescriptions  Medication Sig Dispense Refill  . aspirin 81 MG tablet Take 81 mg by mouth at bedtime.     . cetirizine (ZYRTEC) 10 MG tablet Take 10 mg by mouth at bedtime.     . cholecalciferol (VITAMIN D) 1000 units tablet Take 1,000 Units by mouth daily.    . diclofenac (VOLTAREN) 75 MG EC tablet Take 75 mg by mouth 2 (two) times daily as needed for mild pain.     . diphenhydramine-acetaminophen (TYLENOL PM) 25-500 MG TABS tablet Take 1 tablet by mouth at bedtime as needed (sleep).    . finasteride (PROSCAR) 5 MG tablet Take 1 tablet (5 mg total) by mouth daily. (Patient taking  differently: Take 5 mg by mouth daily after supper. ) 90 tablet 3  . lovastatin (MEVACOR) 20 MG tablet Take 1 tablet (20 mg total) by mouth at bedtime. 90 tablet 3  . Multiple Vitamin (MULTIVITAMIN) tablet Take 1 tablet by mouth daily.    . tamsulosin (FLOMAX) 0.4 MG CAPS capsule Take 1 capsule (0.4 mg total) by mouth daily. (Patient taking differently: Take 0.4 mg by mouth daily after supper. ) 90 capsule 3   No current facility-administered medications for this visit.     Review of Systems Review of Systems  Blood pressure 124/70, pulse 70, resp. rate 12, height 5\' 9"  (1.753 m), weight 152 lb (68.9 kg).  Physical Exam Physical Exam  Constitutional: He is oriented to person, place, and time. He appears well-developed and well-nourished.  Abdominal: Soft. Bowel sounds are normal.    Neurological: He is alert and oriented to person, place, and time.  Skin: Skin is warm and dry.  Psychiatric: His behavior is normal.    Data Reviewed Prior notes reviewed  Assessment    R inguinal hernia repair. Incision is clean and healing well. Some mild swelling and bruising, appropriate for this phase of healing. No hernia  palpated on exam.    Plan    Light activity is permitted. Gradually increase yard work activity starting next week.     Follow up in 1 month.  HPI, Physical Exam, Assessment and Plan have been scribed under the direction and in the presence of Kathreen CosierS. G. Sankar, MD   Dorathy DaftMarsha Hatch, RN  I have completed the exam and reviewed the above documentation for accuracy and completeness.  I agree with the above.  Museum/gallery conservatorDragon Technology has been used and any errors in dictation or transcription are unintentional.  Seeplaputhur G. Evette CristalSankar, M.D., F.A.C.S.  Gerlene BurdockSANKAR,SEEPLAPUTHUR G 07/05/2016, 4:32 PM

## 2016-07-06 ENCOUNTER — Ambulatory Visit (INDEPENDENT_AMBULATORY_CARE_PROVIDER_SITE_OTHER): Payer: Medicare PPO | Admitting: Internal Medicine

## 2016-07-06 ENCOUNTER — Encounter: Payer: Self-pay | Admitting: General Surgery

## 2016-07-06 VITALS — BP 124/70 | HR 78 | Resp 16 | Ht 69.0 in | Wt 153.0 lb

## 2016-07-06 DIAGNOSIS — G4733 Obstructive sleep apnea (adult) (pediatric): Secondary | ICD-10-CM | POA: Diagnosis not present

## 2016-07-06 NOTE — Addendum Note (Signed)
Addended by: Alease FrameARTER, SONYA S on: 07/06/2016 02:13 PM   Modules accepted: Orders

## 2016-07-06 NOTE — Patient Instructions (Signed)
adjust AutoSet pressure range to 8-16

## 2016-07-13 ENCOUNTER — Telehealth: Payer: Self-pay | Admitting: *Deleted

## 2016-07-13 NOTE — Telephone Encounter (Signed)
Wife called to let us know that she was trying to peel up the derma bond and thought the incision was hard. Instructed to use a heating pad to the area. The area should soften up gradually, scarring. If the area worsens then she will callback for a sooner appointment time.

## 2016-08-03 ENCOUNTER — Encounter: Payer: Self-pay | Admitting: General Surgery

## 2016-08-03 ENCOUNTER — Ambulatory Visit (INDEPENDENT_AMBULATORY_CARE_PROVIDER_SITE_OTHER): Payer: Medicare PPO | Admitting: General Surgery

## 2016-08-03 VITALS — BP 108/64 | HR 82 | Resp 12 | Ht 69.0 in | Wt 154.0 lb

## 2016-08-03 DIAGNOSIS — K4090 Unilateral inguinal hernia, without obstruction or gangrene, not specified as recurrent: Secondary | ICD-10-CM

## 2016-08-03 NOTE — Progress Notes (Signed)
Patient ID: Henry Lane, male   DOB: 1941/01/08, 76 y.o.   MRN: [id number]  Chief Complaint  Patient presents with  . Routine Post Op    HPI Henry KlinefelterJames B Lane is a 76 y.o. male.  Here today for postoperative visit, left inguinal hernia on 06-28-16, he states he is doing well, still experiencing some pain at site of surgery. Denies any gastrointestinal issues, bowels are moving regular.  HPI  Past Medical History:  Diagnosis Date  . Benign enlargement of prostate   . Calcium pyrophosphate crystal disease   . Headache    h/o migraines  . History of kidney stones   . Hyperlipidemia   . Left inguinal hernia 05/06/2016  . Renal disorder   . Sleep apnea    CPAP    Past Surgical History:  Procedure Laterality Date  . CATARACT EXTRACTION  2016  . COLONOSCOPY    . EYE SURGERY Bilateral    cataracts  . HERNIA REPAIR     right inguinal  . INGUINAL HERNIA REPAIR Left 06/28/2016   Procedure: HERNIA REPAIR INGUINAL ADULT;  Surgeon: Kieth BrightlySankar, Naryah Clenney G, MD;  Location: [facility] ORS;  Service: General;  Laterality: Left;    Family History  Problem Relation Age of Onset  . Cancer Father        lung  . Colon cancer Neg Hx     Social History Social History  Substance Use Topics  . Smoking status: Never Smoker  . Smokeless tobacco: Never Used  . Alcohol use Yes     Comment: on occasion    No Known Allergies  Current Outpatient Prescriptions  Medication Sig Dispense Refill  . aspirin 81 MG tablet Take 81 mg by mouth at bedtime.     . cetirizine (ZYRTEC) 10 MG tablet Take 10 mg by mouth at bedtime.     . cholecalciferol (VITAMIN D) 1000 units tablet Take 1,000 Units by mouth daily.    . diclofenac (VOLTAREN) 75 MG EC tablet Take 75 mg by mouth 2 (two) times daily as needed for mild pain.     . diphenhydramine-acetaminophen (TYLENOL PM) 25-500 MG TABS tablet Take 1 tablet by mouth at bedtime as needed (sleep).    . finasteride (PROSCAR) 5 MG tablet Take 1 tablet (5 mg total) by mouth  daily. (Patient taking differently: Take 5 mg by mouth daily after supper. ) 90 tablet 3  . lovastatin (MEVACOR) 20 MG tablet Take 1 tablet (20 mg total) by mouth at bedtime. 90 tablet 3  . Multiple Vitamin (MULTIVITAMIN) tablet Take 1 tablet by mouth daily.    . tamsulosin (FLOMAX) 0.4 MG CAPS capsule Take 1 capsule (0.4 mg total) by mouth daily. (Patient taking differently: Take 0.4 mg by mouth daily after supper. ) 90 capsule 3   No current facility-administered medications for this visit.     Review of Systems Review of Systems  Constitutional: Negative.   Respiratory: Negative.   Cardiovascular: Negative.     Blood pressure 108/64, pulse 82, resp. rate 12, height 5\' 9"  (1.753 m), weight 154 lb (69.9 kg).  Physical Exam Physical Exam  Constitutional: He is oriented to person, place, and time. He appears well-developed and well-nourished.  Abdominal: Normal appearance. There is no hepatomegaly.    Neurological: He is alert and oriented to person, place, and time.  Skin: Skin is warm and dry.  Psychiatric: He has a normal mood and affect. His behavior is normal.    Data Reviewed Prior notes  reviewed.  Assessment    Right inguinal hernia repair- incision clean, healed well. No significant swelling or bruising.  Stable exam.    Plan    Return as needed. Medically cleared to do activities as tolerated. Advised to call with questions or concerns.    HPI, Physical Exam, Assessment and Plan have been scribed under the direction and in the presence of Kathreen Cosier, MD  Dorathy Daft, RN  I have completed the exam and reviewed the above documentation for accuracy and completeness.  I agree with the above.  Museum/gallery conservator has been used and any errors in dictation or transcription are unintentional.  Julieanne Hadsall Lane. Evette Cristal, M.D., F.A.C.S.  Henry Lane 08/03/2016, 1:32 PM

## 2016-08-03 NOTE — Patient Instructions (Addendum)
Return as needed. Medically cleared to do activities as tolerated. Advised to call with questions or concerns.

## 2016-08-08 ENCOUNTER — Other Ambulatory Visit: Payer: Self-pay | Admitting: Family Medicine

## 2016-08-08 NOTE — Telephone Encounter (Signed)
Last lipid panel was April 2017 Please ask patient to have fasting lipid panel and repeat of the BMP (order both please; the BMP has expired) I've sent in refills already Thank you

## 2016-08-09 ENCOUNTER — Ambulatory Visit: Payer: Medicare PPO | Admitting: Family Medicine

## 2016-08-09 ENCOUNTER — Other Ambulatory Visit: Payer: Self-pay

## 2016-08-09 DIAGNOSIS — E782 Mixed hyperlipidemia: Secondary | ICD-10-CM

## 2016-08-09 DIAGNOSIS — E875 Hyperkalemia: Secondary | ICD-10-CM

## 2016-08-09 NOTE — Telephone Encounter (Signed)
Left a detail messaged  

## 2016-08-16 ENCOUNTER — Encounter: Payer: Self-pay | Admitting: Family Medicine

## 2016-08-16 ENCOUNTER — Ambulatory Visit (INDEPENDENT_AMBULATORY_CARE_PROVIDER_SITE_OTHER): Payer: Medicare PPO | Admitting: Family Medicine

## 2016-08-16 DIAGNOSIS — E875 Hyperkalemia: Secondary | ICD-10-CM

## 2016-08-16 DIAGNOSIS — Z5181 Encounter for therapeutic drug level monitoring: Secondary | ICD-10-CM | POA: Diagnosis not present

## 2016-08-16 DIAGNOSIS — N400 Enlarged prostate without lower urinary tract symptoms: Secondary | ICD-10-CM

## 2016-08-16 DIAGNOSIS — E782 Mixed hyperlipidemia: Secondary | ICD-10-CM | POA: Diagnosis not present

## 2016-08-16 DIAGNOSIS — R739 Hyperglycemia, unspecified: Secondary | ICD-10-CM | POA: Diagnosis not present

## 2016-08-16 DIAGNOSIS — M170 Bilateral primary osteoarthritis of knee: Secondary | ICD-10-CM

## 2016-08-16 LAB — BASIC METABOLIC PANEL
BUN: 26 mg/dL — AB (ref 7–25)
CO2: 28 mmol/L (ref 20–31)
CREATININE: 0.92 mg/dL (ref 0.70–1.18)
Calcium: 9.1 mg/dL (ref 8.6–10.3)
Chloride: 107 mmol/L (ref 98–110)
GLUCOSE: 81 mg/dL (ref 65–99)
POTASSIUM: 5 mmol/L (ref 3.5–5.3)
Sodium: 142 mmol/L (ref 135–146)

## 2016-08-16 LAB — LIPID PANEL
Cholesterol: 154 mg/dL (ref <200)
HDL: 64 mg/dL (ref >40)
LDL Cholesterol: 75 mg/dL (ref <100)
Total CHOL/HDL Ratio: 2.4 Ratio (ref <5.0)
Triglycerides: 74 mg/dL (ref <150)
VLDL: 15 mg/dL (ref <30)

## 2016-08-16 LAB — ALT: ALT: 14 U/L (ref 9–46)

## 2016-08-16 NOTE — Assessment & Plan Note (Signed)
Check PSA, discussed screening versus following symptoms; back to urologist if rising; consider saw palmetto

## 2016-08-16 NOTE — Assessment & Plan Note (Signed)
Check K today  

## 2016-08-16 NOTE — Assessment & Plan Note (Signed)
Check lipids, avoid saturated fats 

## 2016-08-16 NOTE — Assessment & Plan Note (Signed)
Check labs today.

## 2016-08-16 NOTE — Patient Instructions (Addendum)
Consider saw palmetto, CoQ 10, and turmeric Continue to work with Dr. Nicholos Johnsamachandran about your sleep apnea We'll get labs today

## 2016-08-16 NOTE — Assessment & Plan Note (Signed)
May have been related to cortisone injections, not fasting, stress, infection, etc.; recheck glucose and we'll get A1c today

## 2016-08-16 NOTE — Assessment & Plan Note (Signed)
Has had injections; Dr. Hyacinth MeekerMiller ortho sees him; recommend turmeric

## 2016-08-16 NOTE — Progress Notes (Signed)
BP 118/62   Pulse 94   Temp 98.2 F (36.8 C) (Oral)   Resp 14   Wt 152 lb 9.6 oz (69.2 kg)   SpO2 94%   BMI 22.54 kg/m    Subjective:    Patient ID: Henry Lane, male    DOB: 12/07/1940, 76 y.o.   MRN: [id number]  HPI: Henry Lane is a 76 y.o. male  Chief Complaint  Patient presents with  . Follow-up    on labs    HPI He is having to get used to the CPAP; they increased pressure; Dr. Nicholos Johns; no more episodes of stopping breathing  He had the hernia operation; went pretty well; it had started wrapping around the bowel; no pain or fevers; released back to activity  High cholesterol in 2016; last TG 222; loves sweets; not much fried food  Last glucose 106; no fam hx of diabetes  Hx of high K+; will recheck today  Has not been back to urologist for prostate; no issues with urination; taking medicine, urinating okay  Depression screen [facility] 2/9 08/16/2016 05/06/2016 06/02/2015  Decreased Interest 0 0 0  Down, Depressed, Hopeless 0 0 0  PHQ - 2 Score 0 0 0    Relevant past medical, surgical, family and social history reviewed Past Medical History:  Diagnosis Date  . Benign enlargement of prostate   . Calcium pyrophosphate crystal disease   . Headache    h/o migraines  . History of kidney stones   . Hyperlipidemia   . Left inguinal hernia 05/06/2016  . Renal disorder   . Sleep apnea    CPAP   Past Surgical History:  Procedure Laterality Date  . CATARACT EXTRACTION  2016  . COLONOSCOPY    . EYE SURGERY Bilateral    cataracts  . HERNIA REPAIR     right inguinal  . INGUINAL HERNIA REPAIR Left 06/28/2016   Procedure: HERNIA REPAIR INGUINAL ADULT;  Surgeon: Kieth Brightly, MD;  Location: [facility] ORS;  Service: General;  Laterality: Left;   Family History  Problem Relation Age of Onset  . Cancer Father        lung  . Colon cancer Neg Hx    Social History   Social History  . Marital status: Married    Spouse name: N/A  . Number of children: N/A  .  Years of education: N/A   Occupational History  . Not on file.   Social History Main Topics  . Smoking status: Never Smoker  . Smokeless tobacco: Never Used  . Alcohol use Yes     Comment: on occasion  . Drug use: No  . Sexual activity: Not on file   Other Topics Concern  . Not on file   Social History Narrative  . No narrative on file    Interim medical history since last visit reviewed. Allergies and medications reviewed  Review of Systems Per HPI unless specifically indicated above     Objective:    BP 118/62   Pulse 94   Temp 98.2 F (36.8 C) (Oral)   Resp 14   Wt 152 lb 9.6 oz (69.2 kg)   SpO2 94%   BMI 22.54 kg/m   Wt Readings from Last 3 Encounters:  08/16/16 152 lb 9.6 oz (69.2 kg)  08/03/16 154 lb (69.9 kg)  07/06/16 153 lb (69.4 kg)    Physical Exam  Constitutional: He appears well-developed and well-nourished. No distress.  HENT:  Head: Normocephalic and atraumatic.  Eyes: EOM are normal. No scleral icterus.  Neck: No thyromegaly present.  Cardiovascular: Normal rate and regular rhythm.   Pulmonary/Chest: Effort normal and breath sounds normal. No respiratory distress.  Abdominal: Soft. Bowel sounds are normal. He exhibits no distension.  Musculoskeletal: He exhibits no edema.  Neurological: He is alert.  Skin: Skin is warm and dry. No pallor.  Psychiatric: He has a normal mood and affect. His behavior is normal. Judgment and thought content normal.   Results for orders placed or performed during the hospital encounter of 05/12/16  Magnesium  Result Value Ref Range   Magnesium 2.1 1.7 - 2.4 mg/dL  Basic Metabolic Panel (BMET)  Result Value Ref Range   Sodium 139 135 - 145 mmol/L   Potassium 4.4 3.5 - 5.1 mmol/L   Chloride 102 101 - 111 mmol/L   CO2 29 22 - 32 mmol/L   Glucose, Bld 120 (H) 65 - 99 mg/dL   BUN 20 6 - 20 mg/dL   Creatinine, Ser 4.541.16 0.61 - 1.24 mg/dL   Calcium 9.4 8.9 - 09.810.3 mg/dL   GFR calc non Af Amer 59 (L) >60 mL/min     GFR calc Af Amer >60 >60 mL/min   Anion gap 8 5 - 15      Assessment & Plan:   Problem List Items Addressed This Visit      Musculoskeletal and Integument   Arthritis of both knees    Has had injections; Dr. Hyacinth MeekerMiller ortho sees him; recommend turmeric        Genitourinary   Benign enlargement of prostate    Check PSA, discussed screening versus following symptoms; back to urologist if rising; consider saw palmetto      Relevant Orders   PSA     Other   Medication monitoring encounter    Check labs today      Relevant Orders   ALT   Hyperlipidemia    Check lipids, avoid saturated fats      Hyperkalemia    Check K+ today      Hyperglycemia    May have been related to cortisone injections, not fasting, stress, infection, etc.; recheck glucose and we'll get A1c today      Relevant Orders   Hemoglobin A1c      Follow up plan: Return in about 6 months (around 02/15/2017) for twenty minute follow-up with fasting labs.  An after-visit summary was printed and given to the patient at check-out.  Please see the patient instructions which may contain other information and recommendations beyond what is mentioned above in the assessment and plan.  No orders of the defined types were placed in this encounter.   Orders Placed This Encounter  Procedures  . Hemoglobin A1c  . PSA  . ALT

## 2016-08-17 LAB — HEMOGLOBIN A1C
HEMOGLOBIN A1C: 5.5 % (ref <5.7)
Mean Plasma Glucose: 111 mg/dL

## 2016-08-17 LAB — PSA: PSA: 1.1 ng/mL (ref <=4.0)

## 2016-09-22 ENCOUNTER — Other Ambulatory Visit: Payer: Self-pay | Admitting: Family Medicine

## 2016-09-23 NOTE — Telephone Encounter (Signed)
June 2018 sgpt and lipids reviewed; Rx approved

## 2016-12-19 ENCOUNTER — Telehealth: Payer: Self-pay | Admitting: Internal Medicine

## 2016-12-19 NOTE — Telephone Encounter (Signed)
Kim calling from [facility] asking about paper work they sent in Aug Its a medical necessary form  It is for patient CPAP machine, for the Cushion of the mask  She will be faxing it over again. Please call once done.

## 2016-12-19 NOTE — Telephone Encounter (Signed)
Will be waiting on fax.

## 2016-12-19 NOTE — Telephone Encounter (Signed)
Papers placed in DRs folder to be signed.

## 2016-12-20 NOTE — Telephone Encounter (Signed)
Signed and faxed back.ss

## 2017-01-23 ENCOUNTER — Other Ambulatory Visit: Payer: Self-pay | Admitting: Pharmacy Technician

## 2017-01-23 NOTE — Patient Outreach (Signed)
[facility] Care Management  01/23/2017  Jearl KlinefelterJames B Roselli Feb 14, 1941 [id number]  Successful Outreach call to patients wife, Magda Paganiniudrey. Magda Paganiniudrey states that she is in charge of all patients meds. HIPAA identifiers verified. Magda Paganiniudrey verified that patient is taking Lovastatin 20mg  1 daily, however does have extra tablets left. Magda Paganiniudrey states that patient will be switching to [employer] in 2019 and requested that I request refills for his Lovastatin, Tamsulosin & Finasteride to ensure he doesn't run out during the process of switching insurances.  Called [employer] mail order and spoke to FriendshipLashana whom stated patient had refills on all 3 meds and that she would process to ship out.  Suzan SlickAshley N. Ernesta Ambleoleman, CPhT [facility] Care Management [phone]

## 2017-02-07 ENCOUNTER — Other Ambulatory Visit: Payer: Self-pay

## 2017-02-07 MED ORDER — LOVASTATIN 20 MG PO TABS: 20.0000 mg | ORAL_TABLET | Freq: Every day | ORAL | 1 refills | Status: DC

## 2017-02-07 MED ORDER — TAMSULOSIN HCL 0.4 MG PO CAPS: 0.4000 mg | ORAL_CAPSULE | Freq: Every day | ORAL | 3 refills | Status: DC

## 2017-02-07 MED ORDER — FINASTERIDE 5 MG PO TABS: 5.0000 mg | ORAL_TABLET | Freq: Every day | ORAL | 3 refills | Status: DC

## 2017-02-07 NOTE — Telephone Encounter (Signed)
90 day

## 2017-02-16 ENCOUNTER — Encounter: Payer: Self-pay | Admitting: Family Medicine

## 2017-02-16 ENCOUNTER — Other Ambulatory Visit: Payer: Self-pay

## 2017-02-16 ENCOUNTER — Ambulatory Visit: Payer: Medicare PPO | Admitting: Family Medicine

## 2017-02-16 VITALS — BP 110/64 | HR 67 | Temp 97.9°F | Ht 69.0 in | Wt 153.3 lb

## 2017-02-16 DIAGNOSIS — R739 Hyperglycemia, unspecified: Secondary | ICD-10-CM

## 2017-02-16 DIAGNOSIS — M170 Bilateral primary osteoarthritis of knee: Secondary | ICD-10-CM | POA: Diagnosis not present

## 2017-02-16 DIAGNOSIS — Z5181 Encounter for therapeutic drug level monitoring: Secondary | ICD-10-CM | POA: Diagnosis not present

## 2017-02-16 DIAGNOSIS — N400 Enlarged prostate without lower urinary tract symptoms: Secondary | ICD-10-CM

## 2017-02-16 DIAGNOSIS — E875 Hyperkalemia: Secondary | ICD-10-CM

## 2017-02-16 DIAGNOSIS — Z23 Encounter for immunization: Secondary | ICD-10-CM

## 2017-02-16 DIAGNOSIS — E782 Mixed hyperlipidemia: Secondary | ICD-10-CM

## 2017-02-16 DIAGNOSIS — G4733 Obstructive sleep apnea (adult) (pediatric): Secondary | ICD-10-CM

## 2017-02-16 NOTE — Assessment & Plan Note (Signed)
Check glucose and A1c 

## 2017-02-16 NOTE — Assessment & Plan Note (Signed)
Check K today  

## 2017-02-16 NOTE — Assessment & Plan Note (Signed)
Check fasting ipids; continue statin; limit fatty meats

## 2017-02-16 NOTE — Progress Notes (Signed)
BP 110/64 (BP Location: Left Arm, Patient Position: Sitting, Cuff Size: Normal)   Pulse 67   Temp 97.9 F (36.6 C) (Oral)   Ht 5\' 9"  (1.753 m)   Wt 153 lb 4.8 oz (69.5 kg)   SpO2 97%   BMI 22.64 kg/m    Subjective:    Patient ID: Henry Lane, male    DOB: 1940/04/12, 76 y.o.   MRN: [id number]  HPI: Henry Lane is a 76 y.o. male  Chief Complaint  Patient presents with  . Follow-up  . Hand Pain    Received an injection in it at the time of knee injections, didn't help much   . Immunizations    disucss PNA vaccine    HPI Patient is here for f/u with his wife Had the right 4th trigger finger injected about a month ago, not completely resolved; right-handed Knees both got injected last week; that helped He has OA The right 2nd finger had a trigger issue but that completely resolved  Blood pressure is controlled  High cholesterol; taking lovastatin; no muscle pain; he has tried to cut back  Allergies; taking zyrtec, year-round  He is on two medicines for prostate; urinating well; does have a little trouble getting urine stream started when he forgets to take his pills  Taking his aspirin; no bleeding from nose or urine or stool; occasional gum bleeding if flossing  PPSV-23 given at age 76  Sleep apnea; using the CPAP every night  Hyperglycemia; no dry mouth; no fam hx of diabetes other than aunts  [facility] 02/16/2017 08/16/2016 05/06/2016 06/02/2015  Decreased Interest 0 0 0 0  Down, Depressed, Hopeless 0 0 0 0  PHQ - 2 Score 0 0 0 0   Relevant past medical, surgical, family and social history reviewed Past Medical History:  Diagnosis Date  . Benign enlargement of prostate   . Calcium pyrophosphate crystal disease   . Headache    h/o migraines  . History of kidney stones   . Hyperlipidemia   . Left inguinal hernia 05/06/2016  . Renal disorder   . Sleep apnea    CPAP   Past Surgical History:  Procedure Laterality Date  . CATARACT  EXTRACTION  2016  . COLONOSCOPY    . EYE SURGERY Bilateral    cataracts  . HERNIA REPAIR     right inguinal  . INGUINAL HERNIA REPAIR Left 06/28/2016   Procedure: HERNIA REPAIR INGUINAL ADULT;  Surgeon: Kieth BrightlySankar, Seeplaputhur G, MD;  Location: [facility] ORS;  Service: General;  Laterality: Left;   Family History  Problem Relation Age of Onset  . Cancer Father        lung  . Colon cancer Neg Hx    Social History   Tobacco Use  . Smoking status: Never Smoker  . Smokeless tobacco: Never Used  Substance Use Topics  . Alcohol use: Yes    Comment: on occasion  . Drug use: No    Interim medical history since last visit reviewed. Allergies and medications reviewed  Review of Systems Per HPI unless specifically indicated above     Objective:    BP 110/64 (BP Location: Left Arm, Patient Position: Sitting, Cuff Size: Normal)   Pulse 67   Temp 97.9 F (36.6 C) (Oral)   Ht 5\' 9"  (1.753 m)   Wt 153 lb 4.8 oz (69.5 kg)   SpO2 97%   BMI 22.64 kg/m   Wt Readings from Last 3 Encounters:  02/16/17 153 lb 4.8 oz (69.5 kg)  08/16/16 152 lb 9.6 oz (69.2 kg)  08/03/16 154 lb (69.9 kg)    Physical Exam  Constitutional: He appears well-developed and well-nourished. No distress.  HENT:  Head: Normocephalic and atraumatic.  Eyes: EOM are normal. No scleral icterus.  Neck: No thyromegaly present.  Cardiovascular: Normal rate and regular rhythm.  Pulmonary/Chest: Effort normal and breath sounds normal. No respiratory distress.  Abdominal: Soft. Bowel sounds are normal. He exhibits no distension.  Musculoskeletal: He exhibits no edema.  Neurological: He is alert.  Skin: Skin is warm and dry. No pallor.  Psychiatric: He has a normal mood and affect. His behavior is normal. Judgment and thought content normal.    Results for orders placed or performed in visit on 08/16/16  Hemoglobin A1c  Result Value Ref Range   Hgb A1c MFr Bld 5.5 <5.7 %   Mean Plasma Glucose 111 mg/dL  PSA  Result  Value Ref Range   PSA 1.1 <=4.0 ng/mL  ALT  Result Value Ref Range   ALT 14 9 - 46 U/L  Lipid panel  Result Value Ref Range   Cholesterol 154 <200 mg/dL   Triglycerides 74 <161<150 mg/dL   HDL 64 >09>40 mg/dL   Total CHOL/HDL Ratio 2.4 <5.0 Ratio   VLDL 15 <30 mg/dL   LDL Cholesterol 75 <604<100 mg/dL  Basic Metabolic Panel (BMET)  Result Value Ref Range   Sodium 142 135 - 146 mmol/L   Potassium 5.0 3.5 - 5.3 mmol/L   Chloride 107 98 - 110 mmol/L   CO2 28 20 - 31 mmol/L   Glucose, Bld 81 65 - 99 mg/dL   BUN 26 (H) 7 - 25 mg/dL   Creat 5.400.92 9.810.70 - 1.911.18 mg/dL   Calcium 9.1 8.6 - 47.810.3 mg/dL      Assessment & Plan:   Problem List Items Addressed This Visit      Respiratory   Sleep apnea - Primary    Using CPAP every night        Musculoskeletal and Integument   Arthritis of both knees    Seeing Dr. Hyacinth MeekerMiller; doing well after injections        Genitourinary   Benign enlargement of prostate    Continue the medicines; patient opted against DRE; notify me if any symptoms progress        Other   Medication monitoring encounter    Monitor kidneys and liver      Relevant Orders   COMPLETE METABOLIC PANEL WITH GFR   Hyperlipidemia    Check fasting ipids; continue statin; limit fatty meats      Relevant Orders   Lipid panel   Hyperkalemia    Check K+ today      Relevant Orders   COMPLETE METABOLIC PANEL WITH GFR   Hyperglycemia    Check glucose and A1c      Relevant Orders   Hemoglobin A1c    Other Visit Diagnoses    Need for vaccination with 13-polyvalent pneumococcal conjugate vaccine           Follow up plan: Return in about 6 months (around 08/17/2017) for twenty minute follow-up with fasting labs.  An after-visit summary was printed and given to the patient at check-out.  Please see the patient instructions which may contain other information and recommendations beyond what is mentioned above in the assessment and plan.  No orders of the defined types were  placed in this encounter.  Orders Placed This Encounter  Procedures  . Pneumococcal conjugate vaccine 13-valent IM  . COMPLETE METABOLIC PANEL WITH GFR  . Hemoglobin A1c  . Lipid panel

## 2017-02-16 NOTE — Assessment & Plan Note (Signed)
Seeing Dr. Hyacinth MeekerMiller; doing well after injections

## 2017-02-16 NOTE — Assessment & Plan Note (Signed)
Continue the medicines; patient opted against DRE; notify me if any symptoms progress

## 2017-02-16 NOTE — Assessment & Plan Note (Signed)
Monitor kidneys and liver 

## 2017-02-16 NOTE — Assessment & Plan Note (Signed)
Using CPAP every night . 

## 2017-02-16 NOTE — Patient Instructions (Addendum)
Consider the new shingles vaccine, called Shingrix; you can get that at your pharmacy if interested Try to limit saturated fats in your diet (bologna, hot dogs, barbeque, cheeseburgers, hamburgers, steak, bacon, sausage, cheese, etc.) and get more fresh fruits, vegetables, and whole grains   Pneumococcal Conjugate Vaccine suspension for injection What is this medicine? PNEUMOCOCCAL VACCINE (NEU mo KOK al vak SEEN) is a vaccine used to prevent pneumococcus bacterial infections. These bacteria can cause serious infections like pneumonia, meningitis, and blood infections. This vaccine will lower your chance of getting pneumonia. If you do get pneumonia, it can make your symptoms milder and your illness shorter. This vaccine will not treat an infection and will not cause infection. This vaccine is recommended for infants and young children, adults with certain medical conditions, and adults 65 years or older. This medicine may be used for other purposes; ask your health care provider or pharmacist if you have questions. COMMON BRAND NAME(S): Prevnar, Prevnar 13 What should I tell my health care provider before I take this medicine? They need to know if you have any of these conditions: -bleeding problems -fever -immune system problems -an unusual or allergic reaction to pneumococcal vaccine, diphtheria toxoid, other vaccines, latex, other medicines, foods, dyes, or preservatives -pregnant or trying to get pregnant -breast-feeding How should I use this medicine? This vaccine is for injection into a muscle. It is given by a health care professional. A copy of Vaccine Information Statements will be given before each vaccination. Read this sheet carefully each time. The sheet may change frequently. Talk to your pediatrician regarding the use of this medicine in children. While this drug may be prescribed for children as young as 906 weeks old for selected conditions, precautions do apply. Overdosage: If  you think you have taken too much of this medicine contact a poison control center or emergency room at once. NOTE: This medicine is only for you. Do not share this medicine with others. What if I miss a dose? It is important not to miss your dose. Call your doctor or health care professional if you are unable to keep an appointment. What may interact with this medicine? -medicines for cancer chemotherapy -medicines that suppress your immune function -steroid medicines like prednisone or cortisone This list may not describe all possible interactions. Give your health care provider a list of all the medicines, herbs, non-prescription drugs, or dietary supplements you use. Also tell them if you smoke, drink alcohol, or use illegal drugs. Some items may interact with your medicine. What should I watch for while using this medicine? Mild fever and pain should go away in 3 days or less. Report any unusual symptoms to your doctor or health care professional. What side effects may I notice from receiving this medicine? Side effects that you should report to your doctor or health care professional as soon as possible: -allergic reactions like skin rash, itching or hives, swelling of the face, lips, or tongue -breathing problems -confused -fast or irregular heartbeat -fever over 102 degrees F -seizures -unusual bleeding or bruising -unusual muscle weakness Side effects that usually do not require medical attention (report to your doctor or health care professional if they continue or are bothersome): -aches and pains -diarrhea -fever of 102 degrees F or less -headache -irritable -loss of appetite -pain, tender at site where injected -trouble sleeping This list may not describe all possible side effects. Call your doctor for medical advice about side effects. You may report side effects to FDA at  1-800-FDA-1088. Where should I keep my medicine? This does not apply. This vaccine is given in a  clinic, pharmacy, doctor's office, or other health care setting and will not be stored at home. NOTE: This sheet is a summary. It may not cover all possible information. If you have questions about this medicine, talk to your doctor, pharmacist, or health care provider.  2018 Elsevier/Gold Standard (2013-11-21 10:27:27)

## 2017-02-17 ENCOUNTER — Telehealth: Payer: Self-pay

## 2017-02-17 LAB — COMPLETE METABOLIC PANEL WITH GFR
AG Ratio: 1.9 (calc) (ref 1.0–2.5)
ALKALINE PHOSPHATASE (APISO): 73 U/L (ref 40–115)
ALT: 13 U/L (ref 9–46)
AST: 14 U/L (ref 10–35)
Albumin: 4.1 g/dL (ref 3.6–5.1)
BILIRUBIN TOTAL: 1 mg/dL (ref 0.2–1.2)
BUN: 22 mg/dL (ref 7–25)
CHLORIDE: 103 mmol/L (ref 98–110)
CO2: 29 mmol/L (ref 20–32)
CREATININE: 1.08 mg/dL (ref 0.70–1.18)
Calcium: 9.6 mg/dL (ref 8.6–10.3)
GFR, Est African American: 76 mL/min/{1.73_m2} (ref >=60)
GFR, Est Non African American: 65 mL/min/{1.73_m2} (ref >=60)
GLUCOSE: 86 mg/dL (ref 65–99)
Globulin: 2.2 g/dL (calc) (ref 1.9–3.7)
Potassium: 5 mmol/L (ref 3.5–5.3)
SODIUM: 140 mmol/L (ref 135–146)
Total Protein: 6.3 g/dL (ref 6.1–8.1)

## 2017-02-17 LAB — LIPID PANEL
CHOL/HDL RATIO: 2.6 (calc) (ref <5.0)
CHOLESTEROL: 178 mg/dL (ref <200)
HDL: 69 mg/dL (ref >40)
LDL Cholesterol (Calc): 94 mg/dL (calc)
Non-HDL Cholesterol (Calc): 109 mg/dL (calc) (ref <130)
Triglycerides: 63 mg/dL (ref <150)

## 2017-02-17 LAB — HEMOGLOBIN A1C
EAG (MMOL/L): 6.5 (calc)
HEMOGLOBIN A1C: 5.7 %{Hb} — AB (ref <5.7)
MEAN PLASMA GLUCOSE: 117 (calc)

## 2017-02-17 NOTE — Telephone Encounter (Signed)
Called pt's wife, informed her of lab results. She gave verbal understanding.

## 2017-02-17 NOTE — Telephone Encounter (Signed)
-----   Message from Kerman PasseyMelinda P Lada, MD sent at 02/17/2017  9:01 AM EST ----- Please let Henry Lane know that his labs overall look pretty good; kidney function is overall stable; they don't work like they did when he was 76 years old, though, so stay away from NSAIDs like ibuprofen and goody's powders and Aleve; okay to take his baby aspirin His three month blood sugar is just barely in the "prediabetes" range, but very well-controlled; try to avoid sugary drinks and go easy on sweets and we'll just recheck in 6 months His cholesterol is pretty good; we'll always encourage folks to cut back on bacon and sausage and steak and cheeseburgers, though

## 2017-05-02 ENCOUNTER — Ambulatory Visit: Payer: Medicare PPO | Admitting: Family Medicine

## 2017-05-03 IMAGING — CR DG CHEST 2V
2 series · 2 of 2 positions shown · non-contrast
Comparison: None.

CLINICAL DATA: Dyspnea.

EXAM:
CHEST  2 VIEW

[chest pa]
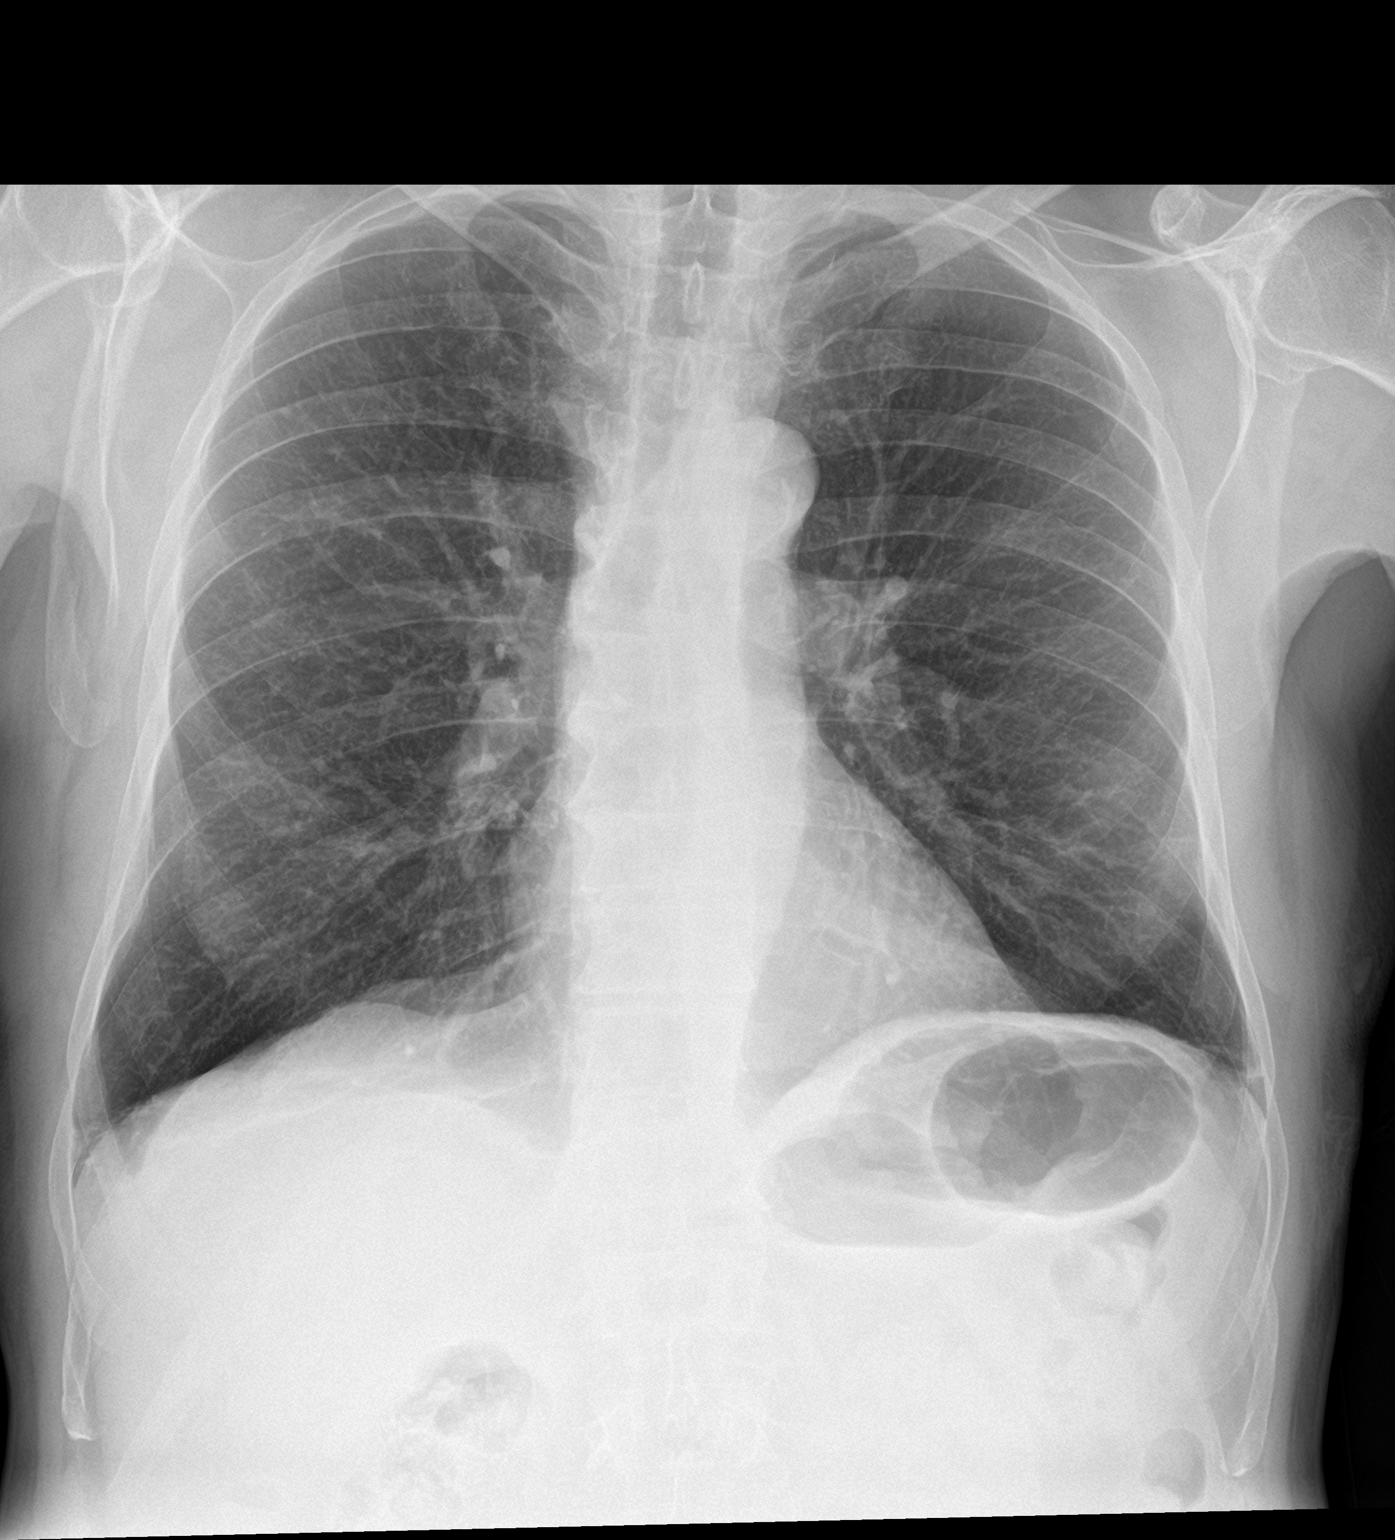

[chest lat]
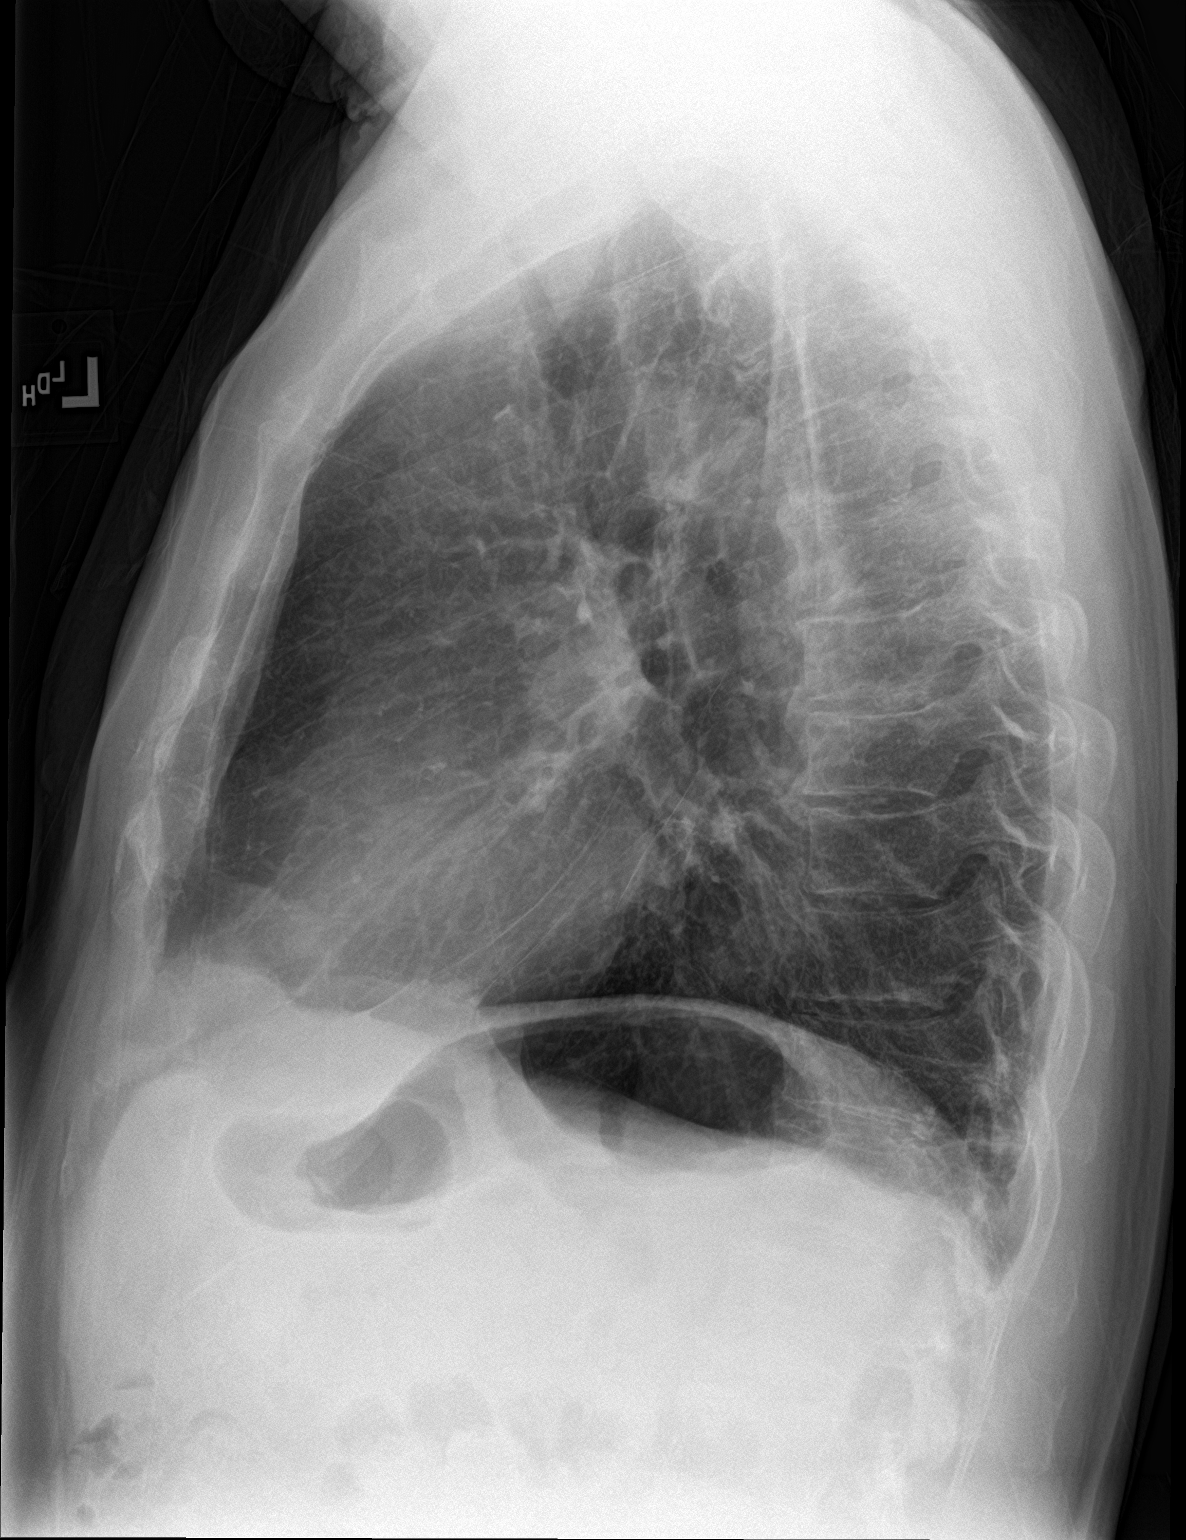

[2 of 2 positions shown; findings below may reference images not displayed]

FINDINGS: Normal heart size and mediastinal contours. No acute infiltrate or
edema. No effusion or pneumothorax. Spondylosis. No acute osseous
findings.
IMPRESSION: Negative chest.

## 2017-05-31 DIAGNOSIS — M170 Bilateral primary osteoarthritis of knee: Secondary | ICD-10-CM | POA: Diagnosis not present

## 2017-06-09 DIAGNOSIS — G4733 Obstructive sleep apnea (adult) (pediatric): Secondary | ICD-10-CM | POA: Diagnosis not present

## 2017-07-26 DIAGNOSIS — R55 Syncope and collapse: Secondary | ICD-10-CM | POA: Diagnosis not present

## 2017-08-17 ENCOUNTER — Encounter: Payer: Self-pay | Admitting: Family Medicine

## 2017-08-17 ENCOUNTER — Ambulatory Visit (INDEPENDENT_AMBULATORY_CARE_PROVIDER_SITE_OTHER): Payer: Medicare Other | Admitting: Family Medicine

## 2017-08-17 DIAGNOSIS — G4733 Obstructive sleep apnea (adult) (pediatric): Secondary | ICD-10-CM | POA: Diagnosis not present

## 2017-08-17 DIAGNOSIS — N400 Enlarged prostate without lower urinary tract symptoms: Secondary | ICD-10-CM | POA: Diagnosis not present

## 2017-08-17 DIAGNOSIS — R739 Hyperglycemia, unspecified: Secondary | ICD-10-CM

## 2017-08-17 DIAGNOSIS — E875 Hyperkalemia: Secondary | ICD-10-CM | POA: Diagnosis not present

## 2017-08-17 DIAGNOSIS — E782 Mixed hyperlipidemia: Secondary | ICD-10-CM | POA: Diagnosis not present

## 2017-08-17 DIAGNOSIS — Z5181 Encounter for therapeutic drug level monitoring: Secondary | ICD-10-CM | POA: Diagnosis not present

## 2017-08-17 NOTE — Assessment & Plan Note (Signed)
Check liver and kidney function 

## 2017-08-17 NOTE — Assessment & Plan Note (Signed)
Using machine; feels fine in the morning

## 2017-08-17 NOTE — Progress Notes (Signed)
BP 110/60   Pulse 67   Temp 97.9 F (36.6 C) (Oral)   Resp 12   Ht 5\' 9"  (1.753 m)   Wt 141 lb 14.4 oz (64.4 kg)   SpO2 96%   BMI 20.95 kg/m    Subjective:    Patient ID: Henry Lane, male    DOB: November 12, 1940, 77 y.o.   MRN: [id number]  HPI: Henry Lane is a 77 y.o. male  Chief Complaint  Patient presents with  . Follow-up   HPI  Here for follow-up  Borderline diabetes; he has cut back on sweets; no more sweet tea; trying to eat better; no dry mouth; no blurred vision; gets up 2x a night to urinate, CPAP; using mask every night Lab Results  Component Value Date   HGBA1C 5.7 (H) 02/16/2017    High cholesterol; trying to eat better some; about 4 eggs a week to 6 a week; on lovastatin  On two medicines for prostate; urine stream good most of the time; not seeing urologist, has not seen then in two years  Arthritis in knees; going to ortho; calcium build-up; getting shots every six months or so; not really limiting activityt  Fall Risk  08/17/2017 02/16/2017 08/16/2016 05/06/2016 06/02/2015  Falls in the past year? No No No No No     Depression screen [facility] 2/9 08/17/2017 02/16/2017 08/16/2016 05/06/2016 06/02/2015  Decreased Interest 0 0 0 0 0  Down, Depressed, Hopeless 0 0 0 0 0  PHQ - 2 Score 0 0 0 0 0    Relevant past medical, surgical, family and social history reviewed Past Medical History:  Diagnosis Date  . Benign enlargement of prostate   . Calcium pyrophosphate crystal disease   . Headache    h/o migraines  . Heat stroke   . History of kidney stones   . Hyperlipidemia   . Left inguinal hernia 05/06/2016  . Renal disorder   . Sleep apnea    CPAP   Past Surgical History:  Procedure Laterality Date  . CATARACT EXTRACTION  2016  . COLONOSCOPY    . EYE SURGERY Bilateral    cataracts  . HERNIA REPAIR     right inguinal  . INGUINAL HERNIA REPAIR Left 06/28/2016   Procedure: HERNIA REPAIR INGUINAL ADULT;  Surgeon: Kieth Brightly, MD;  Location: [facility]  ORS;  Service: General;  Laterality: Left;   Family History  Problem Relation Age of Onset  . Cancer Father        lung  . Colon cancer Neg Hx    Social History   Tobacco Use  . Smoking status: Never Smoker  . Smokeless tobacco: Never Used  Substance Use Topics  . Alcohol use: Yes    Comment: on occasion  . Drug use: No    Interim medical history since last visit reviewed. Allergies and medications reviewed  Review of Systems Per HPI unless specifically indicated above     Objective:    BP 110/60   Pulse 67   Temp 97.9 F (36.6 C) (Oral)   Resp 12   Ht 5\' 9"  (1.753 m)   Wt 141 lb 14.4 oz (64.4 kg)   SpO2 96%   BMI 20.95 kg/m   Wt Readings from Last 3 Encounters:  08/17/17 141 lb 14.4 oz (64.4 kg)  02/16/17 153 lb 4.8 oz (69.5 kg)  08/16/16 152 lb 9.6 oz (69.2 kg)    Physical Exam  Constitutional: He appears well-developed and well-nourished. No  distress.  HENT:  Head: Normocephalic and atraumatic.  Eyes: EOM are normal. No scleral icterus.  Neck: No thyromegaly present.  Cardiovascular: Normal rate and regular rhythm.  Pulmonary/Chest: Effort normal and breath sounds normal.  Abdominal: Soft. Bowel sounds are normal. He exhibits no distension.  Musculoskeletal: He exhibits no edema.  Neurological: Coordination normal.  Skin: Skin is warm and dry. No pallor.  Psychiatric: He has a normal mood and affect. His behavior is normal. Judgment and thought content normal.    Results for orders placed or performed in visit on 02/16/17  COMPLETE METABOLIC PANEL WITH GFR  Result Value Ref Range   Glucose, Bld 86 65 - 99 mg/dL   BUN 22 7 - 25 mg/dL   Creat 1.61 0.96 - 0.45 mg/dL   GFR, Est Non African American 65 > OR = 60 mL/min/1.77m2   GFR, Est African American 76 > OR = 60 mL/min/1.18m2   BUN/Creatinine Ratio NOT APPLICABLE 6 - 22 (calc)   Sodium 140 135 - 146 mmol/L   Potassium 5.0 3.5 - 5.3 mmol/L   Chloride 103 98 - 110 mmol/L   CO2 29 20 - 32 mmol/L    Calcium 9.6 8.6 - 10.3 mg/dL   Total Protein 6.3 6.1 - 8.1 g/dL   Albumin 4.1 3.6 - 5.1 g/dL   Globulin 2.2 1.9 - 3.7 g/dL (calc)   AG Ratio 1.9 1.0 - 2.5 (calc)   Total Bilirubin 1.0 0.2 - 1.2 mg/dL   Alkaline phosphatase (APISO) 73 40 - 115 U/L   AST 14 10 - 35 U/L   ALT 13 9 - 46 U/L  Hemoglobin A1c  Result Value Ref Range   Hgb A1c MFr Bld 5.7 (H) <5.7 % of total Hgb   Mean Plasma Glucose 117 (calc)   eAG (mmol/L) 6.5 (calc)  Lipid panel  Result Value Ref Range   Cholesterol 178 <200 mg/dL   HDL 69 >40 mg/dL   Triglycerides 63 <981 mg/dL   LDL Cholesterol (Calc) 94 mg/dL (calc)   Total CHOL/HDL Ratio 2.6 <5.0 (calc)   Non-HDL Cholesterol (Calc) 109 <130 mg/dL (calc)      Assessment & Plan:   Problem List Items Addressed This Visit      Respiratory   Sleep apnea    Using machine; feels fine in the morning        Genitourinary   Benign enlargement of prostate    Recommended return back to urologist; patient will call; check PSA      Relevant Orders   PSA     Other   Medication monitoring encounter    Check liver and kidney function      Relevant Orders   CBC with Differential/Platelet   COMPLETE METABOLIC PANEL WITH GFR   Hyperlipidemia    Limit egg yolks to no more than 3 per week; try to limit saturated fats; check lipids today; continue medicine      Relevant Orders   Lipid panel   Hyperkalemia    Check K+ today      Hyperglycemia    Check glucose and A1c today; proud of him for limiting sweets      Relevant Orders   Hemoglobin A1c       Follow up plan: Return in about 6 months (around 02/16/2018) for follow-up visit with Dr. Sherie Don; Ammie to do Medicare Wellness visit.  An after-visit summary was printed and given to the patient at check-out.  Please see the patient instructions which may  contain other information and recommendations beyond what is mentioned above in the assessment and plan.  No orders of the defined types were placed in  this encounter.   Orders Placed This Encounter  Procedures  . PSA  . CBC with Differential/Platelet  . COMPLETE METABOLIC PANEL WITH GFR  . Lipid panel  . Hemoglobin A1c

## 2017-08-17 NOTE — Assessment & Plan Note (Signed)
Check glucose and A1c today; proud of him for limiting sweets

## 2017-08-17 NOTE — Assessment & Plan Note (Signed)
Check K today  

## 2017-08-17 NOTE — Assessment & Plan Note (Signed)
Limit egg yolks to no more than 3 per week; try to limit saturated fats; check lipids today; continue medicine

## 2017-08-17 NOTE — Assessment & Plan Note (Signed)
Recommended return back to urologist; patient will call; check PSA

## 2017-08-17 NOTE — Patient Instructions (Addendum)
Please do call Michiel CowboyShannon McGowan, PA-C at Phone: [phone] I'm proud of your dietary changes Try to limit saturated fats in your diet (bologna, hot dogs, barbeque, cheeseburgers, hamburgers, steak, bacon, sausage, cheese, etc.) and get more fresh fruits, vegetables, and whole grains We'll get labs and contact you

## 2017-08-18 LAB — CBC WITH DIFFERENTIAL/PLATELET
BASOS PCT: 0.8 %
Basophils Absolute: 42 cells/uL (ref 0–200)
EOS ABS: 68 {cells}/uL (ref 15–500)
Eosinophils Relative: 1.3 %
HEMATOCRIT: 42.8 % (ref 38.5–50.0)
Hemoglobin: 14.6 g/dL (ref 13.2–17.1)
Lymphs Abs: 1602 cells/uL (ref 850–3900)
MCH: 30.7 pg (ref 27.0–33.0)
MCHC: 34.1 g/dL (ref 32.0–36.0)
MCV: 89.9 fL (ref 80.0–100.0)
MPV: 10.7 fL (ref 7.5–12.5)
Monocytes Relative: 11.2 %
NEUTROS PCT: 55.9 %
Neutro Abs: 2907 cells/uL (ref 1500–7800)
Platelets: 277 10*3/uL (ref 140–400)
RBC: 4.76 10*6/uL (ref 4.20–5.80)
RDW: 12.5 % (ref 11.0–15.0)
Total Lymphocyte: 30.8 %
WBC: 5.2 10*3/uL (ref 3.8–10.8)
WBCMIX: 582 {cells}/uL (ref 200–950)

## 2017-08-18 LAB — COMPLETE METABOLIC PANEL WITH GFR
AG RATIO: 1.8 (calc) (ref 1.0–2.5)
ALT: 12 U/L (ref 9–46)
AST: 15 U/L (ref 10–35)
Albumin: 4.2 g/dL (ref 3.6–5.1)
Alkaline phosphatase (APISO): 65 U/L (ref 40–115)
BILIRUBIN TOTAL: 0.9 mg/dL (ref 0.2–1.2)
BUN: 21 mg/dL (ref 7–25)
CHLORIDE: 104 mmol/L (ref 98–110)
CO2: 30 mmol/L (ref 20–32)
Calcium: 9.6 mg/dL (ref 8.6–10.3)
Creat: 1 mg/dL (ref 0.70–1.18)
GFR, EST AFRICAN AMERICAN: 83 mL/min/{1.73_m2} (ref >=60)
GFR, Est Non African American: 72 mL/min/{1.73_m2} (ref >=60)
GLOBULIN: 2.3 g/dL (ref 1.9–3.7)
Glucose, Bld: 86 mg/dL (ref 65–99)
POTASSIUM: 4.6 mmol/L (ref 3.5–5.3)
SODIUM: 140 mmol/L (ref 135–146)
TOTAL PROTEIN: 6.5 g/dL (ref 6.1–8.1)

## 2017-08-18 LAB — LIPID PANEL
Cholesterol: 161 mg/dL (ref <200)
HDL: 72 mg/dL (ref >40)
LDL Cholesterol (Calc): 75 mg/dL (calc)
Non-HDL Cholesterol (Calc): 89 mg/dL (calc) (ref <130)
TRIGLYCERIDES: 63 mg/dL (ref <150)
Total CHOL/HDL Ratio: 2.2 (calc) (ref <5.0)

## 2017-08-18 LAB — HEMOGLOBIN A1C
EAG (MMOL/L): 5.8 (calc)
Hgb A1c MFr Bld: 5.3 % of total Hgb (ref <5.7)
Mean Plasma Glucose: 105 (calc)

## 2017-08-18 LAB — PSA: PSA: 1.3 ng/mL (ref <=4.0)

## 2017-09-04 DIAGNOSIS — G4733 Obstructive sleep apnea (adult) (pediatric): Secondary | ICD-10-CM | POA: Diagnosis not present

## 2017-09-21 ENCOUNTER — Other Ambulatory Visit: Payer: Self-pay | Admitting: Family Medicine

## 2017-11-02 ENCOUNTER — Encounter: Payer: Self-pay | Admitting: Nurse Practitioner

## 2017-11-02 ENCOUNTER — Ambulatory Visit (INDEPENDENT_AMBULATORY_CARE_PROVIDER_SITE_OTHER): Payer: Medicare Other | Admitting: Nurse Practitioner

## 2017-11-02 VITALS — BP 120/40 | HR 70 | Temp 98.0°F | Resp 16 | Ht 69.0 in | Wt 148.0 lb

## 2017-11-02 DIAGNOSIS — H6123 Impacted cerumen, bilateral: Secondary | ICD-10-CM | POA: Diagnosis not present

## 2017-11-02 NOTE — Progress Notes (Signed)
Name: Henry Lane   MRN: [id number]    DOB: 1940/03/16   Date:11/02/2017       Progress Note  Subjective  Chief Complaint  Chief Complaint  Patient presents with  . Ear Fullness    wife wants his ears checked because of hearing difficulty.    HPI  Patient states wife has noted he is not hearing as well and has had cerumen impacted ears before. Denies tinnitus, hearing loss that he has noticed, ear pain or ear surgeries.   Patient Active Problem List   Diagnosis Date Noted  . Hyperglycemia 08/16/2016  . Hyperkalemia 05/11/2016  . Left inguinal hernia 05/06/2016  . Abnormal EKG 05/06/2016  . Sleep apnea 05/06/2016  . Colon cancer screening 07/13/2015  . Calcium pyrophosphate deposition disease 11/14/2014  . Hyperlipidemia 11/14/2014  . Benign enlargement of prostate 11/14/2014  . Medication monitoring encounter 11/14/2014  . Arthritis of both knees 11/14/2014    Past Medical History:  Diagnosis Date  . Benign enlargement of prostate   . Calcium pyrophosphate crystal disease   . Headache    h/o migraines  . Heat stroke   . History of kidney stones   . Hyperlipidemia   . Left inguinal hernia 05/06/2016  . Renal disorder   . Sleep apnea    CPAP    Past Surgical History:  Procedure Laterality Date  . CATARACT EXTRACTION  2016  . COLONOSCOPY    . EYE SURGERY Bilateral    cataracts  . HERNIA REPAIR     right inguinal  . INGUINAL HERNIA REPAIR Left 06/28/2016   Procedure: HERNIA REPAIR INGUINAL ADULT;  Surgeon: Kieth Brightly, MD;  Location: [facility] ORS;  Service: General;  Laterality: Left;    Social History   Tobacco Use  . Smoking status: Never Smoker  . Smokeless tobacco: Never Used  Substance Use Topics  . Alcohol use: Yes    Comment: on occasion     Current Outpatient Medications:  .  AFLURIA QUADRIVALENT 0.5 ML injection, , Disp: , Rfl:  .  aspirin 81 MG tablet, Take 81 mg by mouth at bedtime. , Disp: , Rfl:  .  cetirizine (ZYRTEC) 10 MG tablet,  Take 10 mg by mouth at bedtime. , Disp: , Rfl:  .  cholecalciferol (VITAMIN D) 1000 units tablet, Take 1,000 Units by mouth daily., Disp: , Rfl:  .  finasteride (PROSCAR) 5 MG tablet, Take 1 tablet (5 mg total) by mouth daily., Disp: 90 tablet, Rfl: 3 .  lovastatin (MEVACOR) 20 MG tablet, TAKE 1 TABLET BY MOUTH AT  BEDTIME, Disp: 90 tablet, Rfl: 1 .  Multiple Vitamin (MULTIVITAMIN) tablet, Take 1 tablet by mouth daily., Disp: , Rfl:  .  tamsulosin (FLOMAX) 0.4 MG CAPS capsule, Take 1 capsule (0.4 mg total) by mouth daily., Disp: 90 capsule, Rfl: 3  No Known Allergies  ROS   No other specific complaints in a complete review of systems (except as listed in HPI above).  Objective  Vitals:   11/02/17 1000  BP: (!) 120/40  Pulse: 70  Resp: 16  Temp: 98 F (36.7 C)  TempSrc: Oral  SpO2: 98%  Weight: 148 lb (67.1 kg)  Height: 5\' 9"  (1.753 m)     Body mass index is 21.86 kg/m.  Nursing Note and Vital Signs reviewed.  Physical Exam  Constitutional: He is oriented to person, place, and time. He appears well-developed and well-nourished.  HENT:  Head: Normocephalic and atraumatic.  Bilaterally impacted ears  Cardiovascular: Normal rate, regular rhythm and normal heart sounds.  Pulmonary/Chest: Effort normal and breath sounds normal.  Neurological: He is alert and oriented to person, place, and time. Coordination normal.  Skin: Skin is warm and dry.      No results found for this or any previous visit (from the past 48 hour(s)).  Assessment & Plan  1. Bilateral impacted cerumen Cleared, TM intact non-injected.  - Ear Lavage

## 2017-11-02 NOTE — Patient Instructions (Signed)
For earwax build up use ONE of these over the counter options to soften up with ear wax in your ear: mineral oil drops, docusate (liquid), debrox. Place a few drops in your ear while laying on your opposite side- Keep laying for 30-60 minutes on that side after application. Can use as often as directed on box daily to soften ear wax. Wash in shower afterwards. Follow instruction on box if different from above.  . do not stick anything in your ear as it further impact the ear wax

## 2017-11-24 ENCOUNTER — Ambulatory Visit (INDEPENDENT_AMBULATORY_CARE_PROVIDER_SITE_OTHER): Payer: Medicare Other

## 2017-11-24 VITALS — BP 110/60 | HR 64 | Temp 98.0°F | Resp 14 | Ht 69.0 in | Wt 148.1 lb

## 2017-11-24 DIAGNOSIS — Z0000 Encounter for general adult medical examination without abnormal findings: Secondary | ICD-10-CM

## 2017-11-24 NOTE — Progress Notes (Signed)
Subjective:   Henry Lane is a 77 y.o. male who presents for Medicare Annual/Subsequent preventive examination.  Review of Systems:  N/A Cardiac Risk Factors include: sedentary lifestyle;advanced age (>39men, >70 women);diabetes mellitus;male gender     Objective:    Vitals: BP 110/60 (BP Location: Left Arm, Patient Position: Sitting, Cuff Size: Normal)   Pulse 64   Temp 98 F (36.7 C) (Oral)   Resp 14   Ht 5\' 9"  (1.753 m)   Wt 148 lb 1.6 oz (67.2 kg)   SpO2 93%   BMI 21.87 kg/m   Body mass index is 21.87 kg/m.  Advanced Directives 11/24/2017 08/16/2016 06/28/2016 06/24/2016 05/06/2016 06/02/2015  Does Patient Have a Medical Advance Directive? Yes No Yes Yes Yes No  Type of Estate agent of [place];Living will - Healthcare Power of [place];Living will - Healthcare Power of [place];Living will -  Does patient want to make changes to medical advance directive? - - No - Patient declined - - -  Copy of Healthcare Power of Attorney in Chart? No - copy requested - No - copy requested - - -  Would patient like information on creating a medical advance directive? - - - - - No - patient declined information    Tobacco Social History   Tobacco Use  Smoking Status Never Smoker  Smokeless Tobacco Never Used  Tobacco Comment   smoking cessation materials not required     Counseling given: No Comment: smoking cessation materials not required  Clinical Intake:  Pre-visit preparation completed: Yes  Pain : No/denies pain   BMI - recorded: 21.87 Nutritional Status: BMI of 19-24  Normal Nutritional Risks: None Diabetes: No  How often do you need to have someone help you when you read instructions, pamphlets, or other written materials from your doctor or pharmacy?: 1 - Never  Interpreter Needed?: No  Information entered by :: AEversole, LPN  Past Medical History:  Diagnosis Date  . Benign enlargement of prostate   . Calcium pyrophosphate crystal disease    . Headache    h/o migraines  . Heat stroke   . History of kidney stones   . Hyperlipidemia   . Left inguinal hernia 05/06/2016  . Renal disorder   . Sleep apnea    CPAP   Past Surgical History:  Procedure Laterality Date  . CATARACT EXTRACTION  2016  . COLONOSCOPY    . EYE SURGERY Bilateral    cataracts  . HERNIA REPAIR     right inguinal  . INGUINAL HERNIA REPAIR Left 06/28/2016   Procedure: HERNIA REPAIR INGUINAL ADULT;  Surgeon: Kieth Brightly, MD;  Location: [facility] ORS;  Service: General;  Laterality: Left;   Family History  Problem Relation Age of Onset  . Cancer Father        lung  . Colon cancer Neg Hx    Social History   Socioeconomic History  . Marital status: Married    Spouse name: Magda Paganini  . Number of children: 2  . Years of education: Not on file  . Highest education level: 12th grade  Occupational History    Employer: RETIRED  Social Needs  . Financial resource strain: Not hard at all  . Food insecurity:    Worry: Never true    Inability: Never true  . Transportation needs:    Medical: No    Non-medical: No  Tobacco Use  . Smoking status: Never Smoker  . Smokeless tobacco: Never Used  . Tobacco comment:  smoking cessation materials not required  Substance and Sexual Activity  . Alcohol use: Yes    Comment: on occasion  . Drug use: No  . Sexual activity: Not Currently  Lifestyle  . Physical activity:    Days per week: 0 days    Minutes per session: 0 min  . Stress: Not at all  Relationships  . Social connections:    Talks on phone: Patient refused    Gets together: Patient refused    Attends religious service: Patient refused    Active member of club or organization: Patient refused    Attends meetings of clubs or organizations: Patient refused    Relationship status: Married  Other Topics Concern  . Not on file  Social History Narrative  . Not on file    Outpatient Encounter Medications as of 11/24/2017  Medication Sig  .  AFLURIA QUADRIVALENT 0.5 ML injection   . aspirin 81 MG tablet Take 81 mg by mouth at bedtime.   . cetirizine (ZYRTEC) 10 MG tablet Take 10 mg by mouth at bedtime.   . cholecalciferol (VITAMIN D) 1000 units tablet Take 1,000 Units by mouth daily.  . finasteride (PROSCAR) 5 MG tablet Take 1 tablet (5 mg total) by mouth daily.  Marland Kitchen lovastatin (MEVACOR) 20 MG tablet TAKE 1 TABLET BY MOUTH AT  BEDTIME  . Multiple Vitamin (MULTIVITAMIN) tablet Take 1 tablet by mouth daily.  . tamsulosin (FLOMAX) 0.4 MG CAPS capsule Take 1 capsule (0.4 mg total) by mouth daily.   No facility-administered encounter medications on file as of 11/24/2017.     Activities of Daily Living In your present state of health, do you have any difficulty performing the following activities: 11/24/2017 08/17/2017  Hearing? N Y  Comment denies hearing aids -  Vision? N N  Comment wears eyeglasses -  Difficulty concentrating or making decisions? N N  Walking or climbing stairs? N N  Dressing or bathing? N N  Doing errands, shopping? N N  Preparing Food and eating ? N -  Comment denies dentures -  Using the Toilet? N -  In the past six months, have you accidently leaked urine? N -  Do you have problems with loss of bowel control? N -  Managing your Medications? N -  Managing your Finances? N -  Housekeeping or managing your Housekeeping? N -  Some recent data might be hidden    Patient Care Team: Lada, Janit Bern, MD as PCP - General (Family Medicine) Deeann Saint, MD as Consulting Physician (Orthopedic Surgery)   Assessment:   This is a routine wellness examination for Henry Lane.  Exercise Activities and Dietary recommendations Current Exercise Habits: The patient does not participate in regular exercise at present, Exercise limited by: None identified  Goals    . DIET - REDUCE SUGAR INTAKE     Recommend to eliminate sweets and to decrease portion sizes by eating 3 small healthy meals and at least 2 healthy snacks per  day.       Fall Risk Fall Risk  11/24/2017 11/02/2017 08/17/2017 02/16/2017 08/16/2016  Falls in the past year? No No No No No  Risk for fall due to : Impaired vision - - - -  Risk for fall due to: Comment wears eyeglasses - - - -    FALL RISK PREVENTION PERTAINING TO THE HOME:  Any stairs in or around the home WITH handrails? Yes  Home free of loose throw rugs in walkways, pet beds, electrical cords, etc?  Yes  Adequate lighting in your home to reduce risk of falls? Yes   ASSISTIVE DEVICES UTILIZED TO PREVENT FALLS:  Life alert? No  Use of a cane, walker or w/c? No  Grab bars in the bathroom? No  Shower chair or bench in shower? No  Elevated toilet seat or a handicapped toilet? No   DME ORDERS:  DME order needed?  No   TIMED UP AND GO:  Was the test performed? Yes .  Length of time to ambulate 10 feet: 6 sec.   GAIT:  Appearance of gait: Gait stead-fast and without the use of an assistive device.  Education: Fall risk prevention has been discussed.  Intervention(s) required? No   Depression Screen PHQ 2/9 Scores 11/24/2017 08/17/2017 02/16/2017 08/16/2016  PHQ - 2 Score 0 0 0 0  PHQ- 9 Score 0 - - -    Cognitive Function     6CIT Screen 11/24/2017  What Year? 0 points  What month? 0 points  What time? 0 points  Count back from 20 0 points  Months in reverse 2 points  Repeat phrase 4 points  Total Score 6    Immunization History  Administered Date(s) Administered  . Influenza-Unspecified 11/29/2014, 01/22/2016  . Pneumococcal Conjugate-13 02/16/2017  . Pneumococcal Polysaccharide-23 12/28/2004  . Td 06/04/2014  . Zoster 04/14/2006    Qualifies for Shingles Vaccine? Yes  Zostavax completed 04/14/06. Due for Shingrix. Education has been provided regarding the importance of this vaccine. Pt has been advised to call insurance company to determine out of pocket expense. Advised may also receive vaccine at local pharmacy or Health Dept. Verbalized acceptance and  understanding.  Flu Vaccine: Due for Flu vaccine. Does the patient want to receive this vaccine today?  No . Education has been provided regarding the importance of this vaccine but still declined. Advised may receive this vaccine at local pharmacy or Health Dept. Aware to provide a copy of the vaccination record if obtained from local pharmacy or Health Dept. Verbalized acceptance and understanding.  Screening Tests Health Maintenance  Topic Date Due  . INFLUENZA VACCINE  12/28/2017 (Originally 09/28/2017)  . TETANUS/TDAP  06/03/2024  . PNA vac Low Risk Adult  Completed   Cancer Screenings:  Colorectal Screening: No longer required.   Lung Cancer Screening: (Low Dose CT Chest recommended if Age 54-80 years, 30 pack-year currently smoking OR have quit w/in 15years.) does not qualify.   Additional Screening:  Hepatitis C Screening: does not qualify  Vision Screening: Recommended annual ophthalmology exams for early detection of glaucoma and other disorders of the eye. Is the patient up to date with their annual eye exam?  Yes  Who is the provider or what is the name of the office in which the pt attends annual eye exams? Dr. Clydene Pugh  Dental Screening: Recommended annual dental exams for proper oral hygiene  Community Resource Referral:  CRR required this visit?  No     Plan:  I have personally reviewed and addressed the Medicare Annual Wellness questionnaire and have noted the following in the patient's chart:  A. Medical and social history B. Use of alcohol, tobacco or illicit drugs  C. Current medications and supplements D. Functional ability and status E.  Nutritional status F.  Physical activity G. Advance directives H. List of other physicians I.  Hospitalizations, surgeries, and ER visits in previous 12 months J.  Vitals K. Screenings such as hearing and vision if needed, cognitive and depression L. Referrals and appointments  In addition, I have reviewed and  discussed with patient certain preventive protocols, quality metrics, and best practice recommendations. A written personalized care plan for preventive services as well as general preventive health recommendations were provided to patient.  See attached scanned questionnaire for additional information.   Signed,  Deon Pilling, LPN Nurse Health Advisor

## 2017-11-24 NOTE — Patient Instructions (Signed)
[name] , Thank you for taking time to come for your Medicare Wellness Visit. I appreciate your ongoing commitment to your health goals. Please review the following plan we discussed and let me know if I can assist you in the future.   Screening recommendations/referrals: Colorectal Screening: No longer required  Vision and Dental Exams: Recommended annual ophthalmology exams for early detection of glaucoma and other disorders of the eye Recommended annual dental exams for proper oral hygiene  Vaccinations: Influenza vaccine: Declined Pneumococcal vaccine: Up to date Tdap vaccine: Up to date Shingles vaccine: Please call your insurance company to determine your out of pocket expense for the Shingrix vaccine. You may receive this vaccine at your local pharmacy.  Advanced directives: Please bring a copy of your POA (Power of Attorney) and/or Living Will to your next appointment.  Goals: Recommend to decrease portion sizes by eating 3 small healthy meals and at least 2 healthy snacks per day.  Next appointment: Please schedule your Annual Wellness Visit with your Nurse Health Advisor in one year.  Preventive Care 79 Years and Older, Male Preventive care refers to lifestyle choices and visits with your health care provider that can promote health and wellness. What does preventive care include?  A yearly physical exam. This is also called an annual well check.  Dental exams once or twice a year.  Routine eye exams. Ask your health care provider how often you should have your eyes checked.  Personal lifestyle choices, including:  Daily care of your teeth and gums.  Regular physical activity.  Eating a healthy diet.  Avoiding tobacco and drug use.  Limiting alcohol use.  Practicing safe sex.  Taking low doses of aspirin every day if recommended by your health care provider..  Taking vitamin and mineral supplements as recommended by your health care provider. What happens  during an annual well check? The services and screenings done by your health care provider during your annual well check will depend on your age, overall health, lifestyle risk factors, and family history of disease. Counseling  Your health care provider may ask you questions about your:  Alcohol use.  Tobacco use.  Drug use.  Emotional well-being.  Home and relationship well-being.  Sexual activity.  Eating habits.  History of falls.  Memory and ability to understand (cognition).  Work and work Astronomer. Screening  You may have the following tests or measurements:  Height, weight, and BMI.  Blood pressure.  Lipid and cholesterol levels. These may be checked every 5 years, or more frequently if you are over 79 years old.  Skin check.  Lung cancer screening. You may have this screening every year starting at age 79 if you have a 30-pack-year history of smoking and currently smoke or have quit within the past 15 years.  Fecal occult blood test (FOBT) of the stool. You may have this test every year starting at age 79.  Flexible sigmoidoscopy or colonoscopy. You may have a sigmoidoscopy every 5 years or a colonoscopy every 10 years starting at age 79.  Prostate cancer screening. Recommendations will vary depending on your family history and other risks.  Hepatitis C blood test.  Hepatitis B blood test.  Sexually transmitted disease (STD) testing.  Diabetes screening. This is done by checking your blood sugar (glucose) after you have not eaten for a while (fasting). You may have this done every 1-3 years.  Abdominal aortic aneurysm (AAA) screening. You may need this if you are a current or former  smoker.  Osteoporosis. You may be screened starting at age 79 if you are at high risk. Talk with your health care provider about your test results, treatment options, and if necessary, the need for more tests. Vaccines  Your health care provider may recommend certain  vaccines, such as:  Influenza vaccine. This is recommended every year.  Tetanus, diphtheria, and acellular pertussis (Tdap, Td) vaccine. You may need a Td booster every 10 years.  Zoster vaccine. You may need this after age 60.  Pneumococcal 13-valent conjugate (PCV13) vaccine. One dose is recommended after age 79.  Pneumococcal polysaccharide (PPSV23) vaccine. One dose is recommended after age 79. Talk to your health care provider about which screenings and vaccines you need and how often you need them. This information is not intended to replace advice given to you by your health care provider. Make sure you discuss any questions you have with your health care provider. Document Released: 03/13/2015 Document Revised: 11/04/2015 Document Reviewed: 12/16/2014 Elsevier Interactive Patient Education  2017 [place] Prevention in the Home Falls can cause injuries. They can happen to people of all ages. There are many things you can do to make your home safe and to help prevent falls. What can I do on the outside of my home?  Regularly fix the edges of walkways and driveways and fix any cracks.  Remove anything that might make you trip as you walk through a door, such as a raised step or threshold.  Trim any bushes or trees on the path to your home.  Use bright outdoor lighting.  Clear any walking paths of anything that might make someone trip, such as rocks or tools.  Regularly check to see if handrails are loose or broken. Make sure that both sides of any steps have handrails.  Any raised decks and porches should have guardrails on the edges.  Have any leaves, snow, or ice cleared regularly.  Use sand or salt on walking paths during winter.  Clean up any spills in your garage right away. This includes oil or grease spills. What can I do in the bathroom?  Use night lights.  Install grab bars by the toilet and in the tub and shower. Do not use towel bars as grab  bars.  Use non-skid mats or decals in the tub or shower.  If you need to sit down in the shower, use a plastic, non-slip stool.  Keep the floor dry. Clean up any water that spills on the floor as soon as it happens.  Remove soap buildup in the tub or shower regularly.  Attach bath mats securely with double-sided non-slip rug tape.  Do not have throw rugs and other things on the floor that can make you trip. What can I do in the bedroom?  Use night lights.  Make sure that you have a light by your bed that is easy to reach.  Do not use any sheets or blankets that are too big for your bed. They should not hang down onto the floor.  Have a firm chair that has side arms. You can use this for support while you get dressed.  Do not have throw rugs and other things on the floor that can make you trip. What can I do in the kitchen?  Clean up any spills right away.  Avoid walking on wet floors.  Keep items that you use a lot in easy-to-reach places.  If you need to reach something above you, use a  strong step stool that has a grab bar.  Keep electrical cords out of the way.  Do not use floor polish or wax that makes floors slippery. If you must use wax, use non-skid floor wax.  Do not have throw rugs and other things on the floor that can make you trip. What can I do with my stairs?  Do not leave any items on the stairs.  Make sure that there are handrails on both sides of the stairs and use them. Fix handrails that are broken or loose. Make sure that handrails are as long as the stairways.  Check any carpeting to make sure that it is firmly attached to the stairs. Fix any carpet that is loose or worn.  Avoid having throw rugs at the top or bottom of the stairs. If you do have throw rugs, attach them to the floor with carpet tape.  Make sure that you have a light switch at the top of the stairs and the bottom of the stairs. If you do not have them, ask someone to add them for  you. What else can I do to help prevent falls?  Wear shoes that:  Do not have high heels.  Have rubber bottoms.  Are comfortable and fit you well.  Are closed at the toe. Do not wear sandals.  If you use a stepladder:  Make sure that it is fully opened. Do not climb a closed stepladder.  Make sure that both sides of the stepladder are locked into place.  Ask someone to hold it for you, if possible.  Clearly mark and make sure that you can see:  Any grab bars or handrails.  First and last steps.  Where the edge of each step is.  Use tools that help you move around (mobility aids) if they are needed. These include:  Canes.  Walkers.  Scooters.  Crutches.  Turn on the lights when you go into a dark area. Replace any light bulbs as soon as they burn out.  Set up your furniture so you have a clear path. Avoid moving your furniture around.  If any of your floors are uneven, fix them.  If there are any pets around you, be aware of where they are.  Review your medicines with your doctor. Some medicines can make you feel dizzy. This can increase your chance of falling. Ask your doctor what other things that you can do to help prevent falls. This information is not intended to replace advice given to you by your health care provider. Make sure you discuss any questions you have with your health care provider. Document Released: 12/11/2008 Document Revised: 07/23/2015 Document Reviewed: 03/21/2014 Elsevier Interactive Patient Education  2017 [employer].

## 2017-12-18 DIAGNOSIS — G4733 Obstructive sleep apnea (adult) (pediatric): Secondary | ICD-10-CM | POA: Diagnosis not present

## 2018-01-11 DIAGNOSIS — H35033 Hypertensive retinopathy, bilateral: Secondary | ICD-10-CM | POA: Diagnosis not present

## 2018-01-11 DIAGNOSIS — H43811 Vitreous degeneration, right eye: Secondary | ICD-10-CM | POA: Diagnosis not present

## 2018-01-11 DIAGNOSIS — H26491 Other secondary cataract, right eye: Secondary | ICD-10-CM | POA: Diagnosis not present

## 2018-01-11 DIAGNOSIS — H26493 Other secondary cataract, bilateral: Secondary | ICD-10-CM | POA: Diagnosis not present

## 2018-01-11 DIAGNOSIS — Z961 Presence of intraocular lens: Secondary | ICD-10-CM | POA: Diagnosis not present

## 2018-02-05 DIAGNOSIS — M170 Bilateral primary osteoarthritis of knee: Secondary | ICD-10-CM | POA: Diagnosis not present

## 2018-02-10 ENCOUNTER — Other Ambulatory Visit: Payer: Self-pay | Admitting: Family Medicine

## 2018-02-16 ENCOUNTER — Encounter: Payer: Self-pay | Admitting: Family Medicine

## 2018-02-16 ENCOUNTER — Ambulatory Visit (INDEPENDENT_AMBULATORY_CARE_PROVIDER_SITE_OTHER): Payer: Medicare Other | Admitting: Family Medicine

## 2018-02-16 VITALS — BP 108/58 | HR 65 | Temp 97.8°F | Ht 69.0 in | Wt 149.1 lb

## 2018-02-16 DIAGNOSIS — M170 Bilateral primary osteoarthritis of knee: Secondary | ICD-10-CM

## 2018-02-16 DIAGNOSIS — N400 Enlarged prostate without lower urinary tract symptoms: Secondary | ICD-10-CM | POA: Diagnosis not present

## 2018-02-16 DIAGNOSIS — G4733 Obstructive sleep apnea (adult) (pediatric): Secondary | ICD-10-CM

## 2018-02-16 DIAGNOSIS — E782 Mixed hyperlipidemia: Secondary | ICD-10-CM

## 2018-02-16 DIAGNOSIS — R739 Hyperglycemia, unspecified: Secondary | ICD-10-CM | POA: Diagnosis not present

## 2018-02-16 NOTE — Progress Notes (Signed)
BP (!) 108/58   Pulse 65   Temp 97.8 F (36.6 C) (Oral)   Ht 5\' 9"  (1.753 m)   Wt 149 lb 1.6 oz (67.6 kg)   SpO2 98%   BMI 22.02 kg/m    Subjective:    Patient ID: Henry Lane, male    DOB: 1940-07-07, 77 y.o.   MRN: [id number]  HPI: Henry Lane is a 77 y.o. male  Chief Complaint  Patient presents with  . Follow-up    HPI Patient is here with his wife  He had a steroid shot in the left knee last week; he cannot tell any improvement; he has pain; can hardly walk at times; orthopaedist; they have suggested another type of shot if that doesn't help; used to have shots in both knees, right knee is okay right now; really active, cutting wood, working hard  Sleep apnea; wearing CPAP every night; has a cleaning machine; feels rested in the morning  Enlargement of the prostate; as long as he takes the medicine, urinating fine; no dribbling; gets up 2-3 x a night; last PSA 1.3; no hx of prostate cancer  Asked about aspirin; >20% risk according to [employer]'s aspirin guide  Prediabetes; cut out a lot of sweets for a while, but back to those; no dry mouth; had cataracts removed; gets up to urinate 2-3x a night; not fasting   Depression screen [facility] 2/9 02/16/2018 11/24/2017 08/17/2017 02/16/2017 08/16/2016  Decreased Interest 0 0 0 0 0  Down, Depressed, Hopeless 0 0 0 0 0  PHQ - 2 Score 0 0 0 0 0  Altered sleeping 0 0 - - -  Tired, decreased energy 0 0 - - -  Change in appetite 0 0 - - -  Feeling bad or failure about yourself  0 0 - - -  Trouble concentrating 0 0 - - -  Moving slowly or fidgety/restless 0 0 - - -  Suicidal thoughts 0 0 - - -  PHQ-9 Score 0 0 - - -  Difficult doing work/chores Not difficult at all Not difficult at all - - -   Fall Risk  02/16/2018 11/24/2017 11/02/2017 08/17/2017 02/16/2017  Falls in the past year? 0 No No No No  Number falls in past yr: 0 - - - -  Injury with Fall? 0 - - - -  Risk for fall due to : - Impaired vision - - -  Risk for fall due to:  Comment - wears eyeglasses - - -    Relevant past medical, surgical, family and social history reviewed Past Medical History:  Diagnosis Date  . Benign enlargement of prostate   . Calcium pyrophosphate crystal disease   . Headache    h/o migraines  . Heat stroke   . History of kidney stones   . Hyperlipidemia   . Left inguinal hernia 05/06/2016  . Renal disorder   . Sleep apnea    CPAP   Past Surgical History:  Procedure Laterality Date  . CATARACT EXTRACTION  2016  . COLONOSCOPY    . EYE SURGERY Bilateral    cataracts  . HERNIA REPAIR     right inguinal  . INGUINAL HERNIA REPAIR Left 06/28/2016   Procedure: HERNIA REPAIR INGUINAL ADULT;  Surgeon: Kieth Brightly, MD;  Location: [facility] ORS;  Service: General;  Laterality: Left;   Family History  Problem Relation Age of Onset  . Cancer Father        lung  .  Colon cancer Neg Hx    Social History   Tobacco Use  . Smoking status: Never Smoker  . Smokeless tobacco: Never Used  . Tobacco comment: smoking cessation materials not required  Substance Use Topics  . Alcohol use: Yes    Comment: on occasion  . Drug use: No     [facility]  AUDIT-C Score  1      Interim medical history since last visit reviewed. Allergies and medications reviewed  Review of Systems Per HPI unless specifically indicated above     Objective:    BP (!) 108/58   Pulse 65   Temp 97.8 F (36.6 C) (Oral)   Ht 5\' 9"  (1.753 m)   Wt 149 lb 1.6 oz (67.6 kg)   SpO2 98%   BMI 22.02 kg/m   Wt Readings from Last 3 Encounters:  02/16/18 149 lb 1.6 oz (67.6 kg)  11/24/17 148 lb 1.6 oz (67.2 kg)  11/02/17 148 lb (67.1 kg)    Physical Exam Constitutional:      General: He is not in acute distress.    Appearance: He is well-developed.  HENT:     Head: Normocephalic and atraumatic.  Eyes:     General: No scleral icterus. Neck:     Thyroid: No thyromegaly.  Cardiovascular:     Rate  and Rhythm: Normal rate and regular rhythm.  Pulmonary:     Effort: Pulmonary effort is normal.     Breath sounds: Normal breath sounds.  Abdominal:     General: Bowel sounds are normal. There is no distension.     Palpations: Abdomen is soft.  Musculoskeletal:     Right knee: He exhibits no swelling, no effusion and no deformity. No tenderness found.     Left knee: He exhibits no swelling, no effusion and no deformity. No tenderness found.     Comments: Crepitus of both knees with flexion and extension  Skin:    General: Skin is warm and dry.     Coloration: Skin is not pale.  Neurological:     Coordination: Coordination normal.  Psychiatric:        Behavior: Behavior normal.        Thought Content: Thought content normal.        Judgment: Judgment normal.    Results for orders placed or performed in visit on 08/17/17  PSA  Result Value Ref Range   PSA 1.3 < OR = 4.0 ng/mL  CBC with Differential/Platelet  Result Value Ref Range   WBC 5.2 3.8 - 10.8 Thousand/uL   RBC 4.76 4.20 - 5.80 Million/uL   Hemoglobin 14.6 13.2 - 17.1 g/dL   HCT 16.142.8 09.638.5 - 04.550.0 %   MCV 89.9 80.0 - 100.0 fL   MCH 30.7 27.0 - 33.0 pg   MCHC 34.1 32.0 - 36.0 g/dL   RDW 40.912.5 81.111.0 - 91.415.0 %   Platelets 277 140 - 400 Thousand/uL   MPV 10.7 7.5 - 12.5 fL   Neutro Abs 2,907 1,500 - 7,800 cells/uL   Lymphs Abs 1,602 850 - 3,900 cells/uL   WBC mixed population 582 200 - 950 cells/uL   Eosinophils Absolute 68 15 - 500 cells/uL   Basophils Absolute 42 0 - 200 cells/uL   Neutrophils Relative % 55.9 %   Total Lymphocyte 30.8 %   Monocytes Relative 11.2 %   Eosinophils Relative 1.3 %   Basophils Relative 0.8 %  COMPLETE METABOLIC PANEL  WITH GFR  Result Value Ref Range   Glucose, Bld 86 65 - 99 mg/dL   BUN 21 7 - 25 mg/dL   Creat 1.611.00 0.960.70 - 0.451.18 mg/dL   GFR, Est Non African American 72 > OR = 60 mL/min/1.5173m2   GFR, Est African American 83 > OR = 60 mL/min/1.4373m2   BUN/Creatinine Ratio NOT APPLICABLE 6 -  22 (calc)   Sodium 140 135 - 146 mmol/L   Potassium 4.6 3.5 - 5.3 mmol/L   Chloride 104 98 - 110 mmol/L   CO2 30 20 - 32 mmol/L   Calcium 9.6 8.6 - 10.3 mg/dL   Total Protein 6.5 6.1 - 8.1 g/dL   Albumin 4.2 3.6 - 5.1 g/dL   Globulin 2.3 1.9 - 3.7 g/dL (calc)   AG Ratio 1.8 1.0 - 2.5 (calc)   Total Bilirubin 0.9 0.2 - 1.2 mg/dL   Alkaline phosphatase (APISO) 65 40 - 115 U/L   AST 15 10 - 35 U/L   ALT 12 9 - 46 U/L  Lipid panel  Result Value Ref Range   Cholesterol 161 <200 mg/dL   HDL 72 >40>40 mg/dL   Triglycerides 63 <981<150 mg/dL   LDL Cholesterol (Calc) 75 mg/dL (calc)   Total CHOL/HDL Ratio 2.2 <5.0 (calc)   Non-HDL Cholesterol (Calc) 89 <191<130 mg/dL (calc)  Hemoglobin [id number]  Result Value Ref Range   Hgb A1c MFr Bld 5.3 <5.7 % of total Hgb   Mean Plasma Glucose 105 (calc)   eAG (mmol/L) 5.8 (calc)      Assessment & Plan:   Problem List Items Addressed This Visit      Respiratory   Sleep apnea    Using the CPAP; managed by Dr. Nicholos Johnsamachandran        Musculoskeletal and Integument   Arthritis of both knees - Primary    Agree with f/u with orthopaedist; suggested PT as an option; offered topical voltaren gel; he'll contact ortho        Genitourinary   Benign enlargement of prostate    Last PSA reviewed; patient's wife wishing for him to see a urologist      Relevant Orders   Ambulatory referral to Urology     Other   Hyperlipidemia    Check lipids; continue statin; limit saturated fats      Relevant Orders   Lipid panel   Hyperglycemia    Hx of prediabetes; will check nonfasting glucose (rather than make him come back) and A1c      Relevant Orders   BASIC METABOLIC PANEL WITH GFR   Hemoglobin A1c       Follow up plan: Return in about 6 months (around 08/18/2018) for follow-up visit with Dr. Sherie DonLada.  An after-visit summary was printed and given to the patient at check-out.  Please see the patient instructions which may contain other information and  recommendations beyond what is mentioned above in the assessment and plan.  No orders of the defined types were placed in this encounter.   Orders Placed This Encounter  Procedures  . BASIC METABOLIC PANEL WITH GFR  . Lipid panel  . Hemoglobin A1c  . Ambulatory referral to Urology

## 2018-02-16 NOTE — Assessment & Plan Note (Signed)
Check lipids; continue statin; limit saturated fats 

## 2018-02-16 NOTE — Assessment & Plan Note (Signed)
Agree with f/u with orthopaedist; suggested PT as an option; offered topical voltaren gel; he'll contact ortho

## 2018-02-16 NOTE — Assessment & Plan Note (Signed)
Hx of prediabetes; will check nonfasting glucose (rather than make him come back) and A1c

## 2018-02-16 NOTE — Assessment & Plan Note (Signed)
Using the CPAP; managed by Dr. Nicholos Johnsamachandran

## 2018-02-16 NOTE — Assessment & Plan Note (Signed)
Last PSA reviewed; patient's wife wishing for him to see a urologist

## 2018-02-16 NOTE — Patient Instructions (Signed)
Try to limit saturated fats in your diet (bologna, hot dogs, barbeque, cheeseburgers, hamburgers, steak, bacon, sausage, cheese, etc.) and get more fresh fruits, vegetables, and whole grains Let's get labs today We'll have you see the orthopaedist (you please call for that appointment) and we'll have someone make an appointment for you to see the urologist If you have not heard anything from my staff in a week about any orders/referrals/studies from today, please contact us here to follow-up [phone]

## 2018-02-17 LAB — LIPID PANEL
CHOLESTEROL: 164 mg/dL (ref <200)
HDL: 65 mg/dL (ref >40)
LDL Cholesterol (Calc): 79 mg/dL (calc)
Non-HDL Cholesterol (Calc): 99 mg/dL (calc) (ref <130)
TRIGLYCERIDES: 115 mg/dL (ref <150)
Total CHOL/HDL Ratio: 2.5 (calc) (ref <5.0)

## 2018-02-17 LAB — BASIC METABOLIC PANEL WITH GFR
BUN/Creatinine Ratio: 27 (calc) — ABNORMAL HIGH (ref 6–22)
BUN: 29 mg/dL — AB (ref 7–25)
CALCIUM: 10 mg/dL (ref 8.6–10.3)
CHLORIDE: 105 mmol/L (ref 98–110)
CO2: 28 mmol/L (ref 20–32)
Creat: 1.07 mg/dL (ref 0.70–1.18)
GFR, Est African American: 76 mL/min/{1.73_m2} (ref >=60)
GFR, Est Non African American: 66 mL/min/{1.73_m2} (ref >=60)
GLUCOSE: 84 mg/dL (ref 65–99)
Potassium: 5.6 mmol/L — ABNORMAL HIGH (ref 3.5–5.3)
Sodium: 141 mmol/L (ref 135–146)

## 2018-02-17 LAB — HEMOGLOBIN A1C
Hgb A1c MFr Bld: 5.3 % of total Hgb (ref <5.7)
Mean Plasma Glucose: 105 (calc)
eAG (mmol/L): 5.8 (calc)

## 2018-02-19 ENCOUNTER — Other Ambulatory Visit
Admission: RE | Admit: 2018-02-19 | Discharge: 2018-02-19 | Disposition: A | Discharge status: Home / Self Care | Payer: Medicare Other | Source: Ambulatory Visit | Attending: Family Medicine | Admitting: Family Medicine

## 2018-02-19 ENCOUNTER — Telehealth: Payer: Self-pay

## 2018-02-19 DIAGNOSIS — E875 Hyperkalemia: Secondary | ICD-10-CM | POA: Insufficient documentation

## 2018-02-19 LAB — POTASSIUM: Potassium: 5.2 mmol/L — ABNORMAL HIGH (ref 3.5–5.1)

## 2018-02-19 NOTE — Telephone Encounter (Signed)
-----   Message from Kerman PasseyMelinda P Lada, MD sent at 02/19/2018  9:07 AM EST ----- Henry Lane, please let the patient know that he appears a little dehydrated; try to drink more water His potassium is a little above normal; make sure he is NOT using any salt substitutes like Morton's Lite Salt or Nu-Salt; don't drink fruit smoothies; RECHECK just a potassium today at the hospital and specify for the lab to NOT use a butterfly needle Dx: hyperkalemia Cholesterol is pretty good; A1c is great

## 2018-02-19 NOTE — Telephone Encounter (Signed)
-----   Message from Kerman PasseyMelinda P Lada, MD sent at 02/19/2018  1:08 PM EST ----- Asher MuirJamie, please let the patient know that his repeat potassium is better, but it is still higher than normal We really appreciate him having this done Have him avoid bananas, orange juice, other foods high in potassium Please call his pharmacist and ask him or her to run a check to see if his drugs could explain his mild hyperkalemia; if not, I will want to get him to a nephrologist

## 2018-03-15 DIAGNOSIS — M1712 Unilateral primary osteoarthritis, left knee: Secondary | ICD-10-CM | POA: Diagnosis not present

## 2018-03-19 ENCOUNTER — Other Ambulatory Visit: Payer: Self-pay | Admitting: Family Medicine

## 2018-03-28 ENCOUNTER — Ambulatory Visit: Payer: Medicare Other | Admitting: Urology

## 2018-03-28 ENCOUNTER — Encounter: Payer: Self-pay | Admitting: Urology

## 2018-03-28 VITALS — BP 129/64 | HR 77 | Ht 69.0 in | Wt 150.5 lb

## 2018-03-28 DIAGNOSIS — R350 Frequency of micturition: Secondary | ICD-10-CM

## 2018-03-28 DIAGNOSIS — N401 Enlarged prostate with lower urinary tract symptoms: Secondary | ICD-10-CM | POA: Diagnosis not present

## 2018-03-28 LAB — BLADDER SCAN AMB NON-IMAGING: Scan Result: 27

## 2018-03-28 MED ORDER — TAMSULOSIN HCL 0.4 MG PO CAPS: 0.4000 mg | ORAL_CAPSULE | Freq: Every day | ORAL | 3 refills | Status: AC

## 2018-03-28 MED ORDER — FINASTERIDE 5 MG PO TABS: 5.0000 mg | ORAL_TABLET | Freq: Every day | ORAL | 3 refills | Status: AC

## 2018-03-28 NOTE — Progress Notes (Signed)
03/28/2018 1:21 PM   MIMS MALTA 29-May-1940 [id number]  Referring provider: Kerman Passey, MD [address] [place], [place] [address]  Chief Complaint  Patient presents with  . Benign Prostatic Hypertrophy    HPI:  Consultation for BPH and LUTS. His AUASS = 18 with predominant weak stream, frequency and urgency. He is on tamsulosin and finasteride. He uses a CPAP. Noc x 2. His PSA was 1.3. June 2019.   PVR 27 ml.   Modifying factors: There are no other modifying factors  Associated signs and symptoms: There are no other associated signs and symptoms Aggravating and relieving factors: There are no other aggravating or relieving factors Severity: Moderate Duration: Persistent    PMH: Past Medical History:  Diagnosis Date  . Benign enlargement of prostate   . Calcium pyrophosphate crystal disease   . Headache    h/o migraines  . Heat stroke   . History of kidney stones   . Hyperlipidemia   . Left inguinal hernia 05/06/2016  . Renal disorder   . Sleep apnea    CPAP    Surgical History: Past Surgical History:  Procedure Laterality Date  . CATARACT EXTRACTION  2016  . COLONOSCOPY    . EYE SURGERY Bilateral    cataracts  . HERNIA REPAIR     right inguinal  . INGUINAL HERNIA REPAIR Left 06/28/2016   Procedure: HERNIA REPAIR INGUINAL ADULT;  Surgeon: Kieth Brightly, MD;  Location: [facility] ORS;  Service: General;  Laterality: Left;    Home Medications:  Allergies as of 03/28/2018   No Known Allergies     Medication List       Accurate as of March 28, 2018  1:21 PM. Always use your most recent med list.        aspirin 81 MG tablet Take 81 mg by mouth at bedtime.   cetirizine 10 MG tablet Commonly known as:  ZYRTEC Take 10 mg by mouth at bedtime.   cholecalciferol 1000 units tablet Commonly known as:  VITAMIN D Take 1,000 Units by mouth daily.   finasteride 5 MG tablet Commonly known as:  PROSCAR TAKE 1 TABLET BY MOUTH  DAILY   lovastatin 20 MG tablet Commonly known as:  MEVACOR TAKE 1 TABLET BY MOUTH AT  BEDTIME   multivitamin tablet Take 1 tablet by mouth daily.   tamsulosin 0.4 MG Caps capsule Commonly known as:  FLOMAX TAKE 1 CAPSULE BY MOUTH  DAILY       Allergies: No Known Allergies  Family History: Family History  Problem Relation Age of Onset  . Cancer Father        lung  . Colon cancer Neg Hx     Social History:  reports that he has never smoked. He has never used smokeless tobacco. He reports current alcohol use. He reports that he does not use drugs.  ROS: UROLOGY Frequent Urination?: No Hard to postpone urination?: No Burning/pain with urination?: No Get up at night to urinate?: No Leakage of urine?: No Urine stream starts and stops?: No Trouble starting stream?: No Do you have to strain to urinate?: No Blood in urine?: No Urinary tract infection?: No Sexually transmitted disease?: No Injury to kidneys or bladder?: No Painful intercourse?: No Weak stream?: No Erection problems?: No Penile pain?: No  Gastrointestinal Nausea?: No Vomiting?: No Indigestion/heartburn?: No Diarrhea?: No Constipation?: No  Constitutional Fever: No Night sweats?: No Weight loss?: No Fatigue?: No  Skin Skin rash/lesions?: No Itching?: No  Eyes Blurred vision?: No Double vision?: No  Ears/Nose/Throat Sore throat?: No Sinus problems?: No  Hematologic/Lymphatic Swollen glands?: No Easy bruising?: No  Cardiovascular Leg swelling?: No Chest pain?: No  Respiratory Cough?: No Shortness of breath?: No  Endocrine Excessive thirst?: No  Musculoskeletal Back pain?: No Joint pain?: No  Neurological Headaches?: No Dizziness?: No  Psychologic Depression?: No Anxiety?: No  Physical Exam: BP 129/64 (BP Location: Left Arm, Patient Position: Sitting, Cuff Size: Normal)   Pulse 77   Ht 5\' 9"  (1.753 m)   Wt 68.3 kg   BMI 22.22 kg/m   Constitutional:  Alert and  oriented, No acute distress. HEENT: [place] AT, moist mucus membranes.  Trachea midline, no masses. Cardiovascular: No clubbing, cyanosis, or edema. Respiratory: Normal respiratory effort, no increased work of breathing. GI: Abdomen is soft, nontender, nondistended, no abdominal masses GU: No CVA tenderness Skin: No rashes, bruises or suspicious lesions. Neurologic: Grossly intact, no focal deficits, moving all 4 extremities. Psychiatric: Normal mood and affect. DRE: prostate about 60 g, smooth, no hard area or nodule   Laboratory Data: Lab Results  Component Value Date   WBC 5.2 08/17/2017   HGB 14.6 08/17/2017   HCT 42.8 08/17/2017   MCV 89.9 08/17/2017   PLT 277 08/17/2017    Lab Results  Component Value Date   CREATININE 1.07 02/16/2018    Lab Results  Component Value Date   PSA 1.3 08/17/2017   PSA 1.1 08/16/2016   [facility] 11/29/2012    No results found for: TESTOSTERONE  Lab Results  Component Value Date   HGBA1C 5.3 02/16/2018    Urinalysis No results found for: COLORURINE, APPEARANCEUR, LABSPEC, PHURINE, GLUCOSEU, HGBUR, BILIRUBINUR, KETONESUR, PROTEINUR, UROBILINOGEN, NITRITE, LEUKOCYTESUR  No results found for: LABMICR, WBCUA, RBCUA, LABEPIT, MUCUS, BACTERIA   No results found for this or any previous visit. No results found for this or any previous visit. No results found for this or any previous visit. No results found for this or any previous visit. No results found for this or any previous visit. No results found for this or any previous visit. No results found for this or any previous visit. No results found for this or any previous visit.  Assessment & Plan:    1. Benign prostatic hyperplasia with LUTS - he is stable on combination therapy. We could consider adding an OAB med in the future but there are side effects. Discussed FDA warnings re: 5ari.   - Bladder Scan (Post Void Residual) in office   No follow-ups on file.  Jerilee Field, MD  [facility] [address] [place], [place] [address] [phone]

## 2018-03-28 NOTE — Patient Instructions (Signed)

## 2018-04-10 DIAGNOSIS — E875 Hyperkalemia: Secondary | ICD-10-CM | POA: Diagnosis not present

## 2018-04-13 ENCOUNTER — Other Ambulatory Visit: Payer: Self-pay | Admitting: Family Medicine

## 2018-04-13 DIAGNOSIS — Z5181 Encounter for therapeutic drug level monitoring: Secondary | ICD-10-CM

## 2018-04-13 DIAGNOSIS — E875 Hyperkalemia: Secondary | ICD-10-CM

## 2018-04-13 NOTE — Telephone Encounter (Signed)
Lab Results  Component Value Date   CHOL 164 02/16/2018   HDL 65 02/16/2018   LDLCALC 79 02/16/2018   TRIG 115 02/16/2018   CHOLHDL 2.5 02/16/2018   Lab Results  Component Value Date   ALT 12 08/17/2017   Please ask the patient to have labs done in the next 5-7 days; I'll order CMP We'll check his liver enzymes (last was just over 6 months ago) and repeat that potassium; thank you Statin Rx approved

## 2018-04-13 NOTE — Telephone Encounter (Signed)
Left detailed VM, CRM created.  

## 2018-04-17 ENCOUNTER — Encounter: Payer: Self-pay | Admitting: Family Medicine

## 2018-04-24 ENCOUNTER — Other Ambulatory Visit: Payer: Self-pay

## 2018-04-24 DIAGNOSIS — Z5181 Encounter for therapeutic drug level monitoring: Secondary | ICD-10-CM | POA: Diagnosis not present

## 2018-04-24 DIAGNOSIS — E875 Hyperkalemia: Secondary | ICD-10-CM | POA: Diagnosis not present

## 2018-04-25 ENCOUNTER — Encounter: Payer: Self-pay | Admitting: Family Medicine

## 2018-04-25 DIAGNOSIS — N182 Chronic kidney disease, stage 2 (mild): Secondary | ICD-10-CM | POA: Insufficient documentation

## 2018-04-25 LAB — COMPLETE METABOLIC PANEL WITH GFR
AG RATIO: 1.8 (calc) (ref 1.0–2.5)
ALT: 12 U/L (ref 9–46)
AST: 16 U/L (ref 10–35)
Albumin: 4 g/dL (ref 3.6–5.1)
Alkaline phosphatase (APISO): 67 U/L (ref 35–144)
BUN: 22 mg/dL (ref 7–25)
CALCIUM: 9.3 mg/dL (ref 8.6–10.3)
CO2: 30 mmol/L (ref 20–32)
CREATININE: 1.04 mg/dL (ref 0.70–1.18)
Chloride: 105 mmol/L (ref 98–110)
GFR, EST AFRICAN AMERICAN: 79 mL/min/{1.73_m2} (ref >=60)
GFR, EST NON AFRICAN AMERICAN: 68 mL/min/{1.73_m2} (ref >=60)
GLUCOSE: 90 mg/dL (ref 65–99)
Globulin: 2.2 g/dL (calc) (ref 1.9–3.7)
POTASSIUM: 5 mmol/L (ref 3.5–5.3)
Sodium: 141 mmol/L (ref 135–146)
TOTAL PROTEIN: 6.2 g/dL (ref 6.1–8.1)
Total Bilirubin: 0.7 mg/dL (ref 0.2–1.2)

## 2018-04-25 NOTE — Telephone Encounter (Signed)
Henry Lane, please let the patient know that we appreciate him having his labs done. His glucose is normal. His kidney function is stable. His kidneys don't work like they did when he was 78 years old, but they are hanging in there for a gentleman who is 63. To preserve kidney function, stay well-hydrated and avoid NSAIDs. His potassium is normal. His liver enzymes are normal. Thank you

## 2018-05-09 DIAGNOSIS — M170 Bilateral primary osteoarthritis of knee: Secondary | ICD-10-CM | POA: Diagnosis not present

## 2018-08-21 ENCOUNTER — Ambulatory Visit: Payer: Medicare Other | Admitting: Family Medicine

## 2018-09-03 ENCOUNTER — Ambulatory Visit (INDEPENDENT_AMBULATORY_CARE_PROVIDER_SITE_OTHER): Payer: Medicare Other | Admitting: Nurse Practitioner

## 2018-09-03 ENCOUNTER — Encounter: Payer: Self-pay | Admitting: Nurse Practitioner

## 2018-09-03 DIAGNOSIS — M65331 Trigger finger, right middle finger: Secondary | ICD-10-CM | POA: Diagnosis not present

## 2018-09-03 DIAGNOSIS — R739 Hyperglycemia, unspecified: Secondary | ICD-10-CM | POA: Diagnosis not present

## 2018-09-03 DIAGNOSIS — G4733 Obstructive sleep apnea (adult) (pediatric): Secondary | ICD-10-CM

## 2018-09-03 DIAGNOSIS — E782 Mixed hyperlipidemia: Secondary | ICD-10-CM

## 2018-09-03 DIAGNOSIS — N182 Chronic kidney disease, stage 2 (mild): Secondary | ICD-10-CM

## 2018-09-03 DIAGNOSIS — R350 Frequency of micturition: Secondary | ICD-10-CM

## 2018-09-03 DIAGNOSIS — N401 Enlarged prostate with lower urinary tract symptoms: Secondary | ICD-10-CM

## 2018-09-03 MED ORDER — LOVASTATIN 20 MG PO TABS: 20.0000 mg | ORAL_TABLET | Freq: Every day | ORAL | 1 refills | Status: DC

## 2018-09-03 NOTE — Progress Notes (Signed)
Virtual Visit via Telephone Note  I connected with Henry Lane on 09/03/18 at  9:40 AM EDT by telephone and verified that I am speaking with the correct person using two identifiers.   Staff discussed the limitations, risks, security and privacy concerns of performing an evaluation and management service by telephone and the availability of in person appointments. Staff also discussed with the patient that there may be a patient responsible charge related to this service. The patient expressed understanding and agreed to proceed.  Patients location: home My location: work office  Other people in meeting: wife, Salvatore Shear     HPI Hyperlipidemia Eats 3 meals a day, eats fruits and vegetables almost daily.  Fried foods and red meats 2 times a week  Takes lovastatin 20mg  daily, no missed doses. Denies myalgias.  Lab Results  Component Value Date   CHOL 164 02/16/2018   HDL 65 02/16/2018   LDLCALC 79 02/16/2018   TRIG 115 02/16/2018   CHOLHDL 2.5 02/16/2018    Sleep apnea 100% self reported compliance.   BPH He is taking finasteride 5mg  daily and flomax 0.4mg  daily. States this works with   Patient has been having trigger finger on right hand middle finger intermittently for the past month or so. Painful when it locks up he has to to massage is straight again.   PHQ2/9: Depression screen [facility] 2/9 09/03/2018 02/16/2018 11/24/2017 08/17/2017 02/16/2017  Decreased Interest 0 0 0 0 0  Down, Depressed, Hopeless 0 0 0 0 0  PHQ - 2 Score 0 0 0 0 0  Altered sleeping 0 0 0 - -  Tired, decreased energy 0 0 0 - -  Change in appetite 0 0 0 - -  Feeling bad or failure about yourself  0 0 0 - -  Trouble concentrating 0 0 0 - -  Moving slowly or fidgety/restless 0 0 0 - -  Suicidal thoughts 0 0 0 - -  PHQ-9 Score 0 0 0 - -  Difficult doing work/chores Not difficult at all Not difficult at all Not difficult at all - -     PHQ reviewed. Negative  Patient Active Problem List   Diagnosis Date  Noted  . CKD (chronic kidney disease) stage 2, GFR 60-89 ml/min 04/25/2018  . Hyperglycemia 08/16/2016  . Hyperkalemia 05/11/2016  . Left inguinal hernia 05/06/2016  . Abnormal EKG 05/06/2016  . Sleep apnea 05/06/2016  . Colon cancer screening 07/13/2015  . Calcium pyrophosphate deposition disease 11/14/2014  . Hyperlipidemia 11/14/2014  . Benign enlargement of prostate 11/14/2014  . Medication monitoring encounter 11/14/2014  . Arthritis of both knees 11/14/2014    Past Medical History:  Diagnosis Date  . Benign enlargement of prostate   . Calcium pyrophosphate crystal disease   . Headache    h/o migraines  . Heat stroke   . History of kidney stones   . Hyperlipidemia   . Left inguinal hernia 05/06/2016  . Renal disorder   . Sleep apnea    CPAP    Past Surgical History:  Procedure Laterality Date  . CATARACT EXTRACTION  2016  . COLONOSCOPY    . EYE SURGERY Bilateral    cataracts  . HERNIA REPAIR     right inguinal  . INGUINAL HERNIA REPAIR Left 06/28/2016   Procedure: HERNIA REPAIR INGUINAL ADULT;  Surgeon: Christene Lye, MD;  Location: [facility] ORS;  Service: General;  Laterality: Left;    Social History   Tobacco Use  . Smoking status:  Never Smoker  . Smokeless tobacco: Never Used  . Tobacco comment: smoking cessation materials not required  Substance Use Topics  . Alcohol use: Yes    Comment: on occasion     Current Outpatient Medications:  .  aspirin 81 MG tablet, Take 81 mg by mouth at bedtime. , Disp: , Rfl:  .  cetirizine (ZYRTEC) 10 MG tablet, Take 10 mg by mouth at bedtime. , Disp: , Rfl:  .  cholecalciferol (VITAMIN D) 1000 units tablet, Take 1,000 Units by mouth daily., Disp: , Rfl:  .  finasteride (PROSCAR) 5 MG tablet, Take 1 tablet (5 mg total) by mouth daily., Disp: 90 tablet, Rfl: 3 .  lovastatin (MEVACOR) 20 MG tablet, TAKE 1 TABLET BY MOUTH AT  BEDTIME, Disp: 90 tablet, Rfl: 1 .  Multiple Vitamin (MULTIVITAMIN) tablet, Take 1 tablet by  mouth daily., Disp: , Rfl:  .  tamsulosin (FLOMAX) 0.4 MG CAPS capsule, Take 1 capsule (0.4 mg total) by mouth daily., Disp: 90 capsule, Rfl: 3  No Known Allergies  Review of Systems  Constitutional: Negative for chills, fever and malaise/fatigue.  Respiratory: Negative for cough and shortness of breath.   Cardiovascular: Negative for chest pain, palpitations and leg swelling.  Gastrointestinal: Negative for abdominal pain.  Musculoskeletal: Negative for joint pain and myalgias.  Skin: Negative for rash.  Neurological: Negative for dizziness and headaches.  Psychiatric/Behavioral: The patient is not nervous/anxious and does not have insomnia.       No other specific complaints in a complete review of systems (except as listed in HPI above).  Objective  There were no vitals filed for this visit.   There is no height or weight on file to calculate BMI.  Patient is alert, able to speak in full sentences without difficulty.   Assessment & Plan  1. Mixed hyperlipidemia - lovastatin (MEVACOR) 20 MG tablet; Take 1 tablet (20 mg total) by mouth at bedtime.  Dispense: 90 tablet; Refill: 1 - Lipid Profile  2. Obstructive sleep apnea syndrome Continue cpap   3. Benign prostatic hyperplasia with urinary frequency Follows up with urology, controlled.   4. Trigger middle finger of right hand Discussed exercises, ice, follow-up if not improved.   5. Hyperglycemia - COMPLETE METABOLIC PANEL WITH GFR  6. CKD (chronic kidney disease) stage 2, GFR 60-89 ml/min Hydration  - COMPLETE METABOLIC PANEL WITH GFR     I discussed the assessment and treatment plan with the patient. The patient was provided an opportunity to ask questions and all were answered. The patient agreed with the plan and demonstrated an understanding of the instructions.   The patient was advised to call back or seek an in-person evaluation if the symptoms worsen or if the condition fails to improve as  anticipated.  I provided 16 minutes of non-face-to-face time during this encounter.   Cheryle HorsfallElizabeth E Poulose, NP

## 2018-09-04 DIAGNOSIS — E782 Mixed hyperlipidemia: Secondary | ICD-10-CM | POA: Diagnosis not present

## 2018-09-04 DIAGNOSIS — N182 Chronic kidney disease, stage 2 (mild): Secondary | ICD-10-CM | POA: Diagnosis not present

## 2018-09-04 DIAGNOSIS — R739 Hyperglycemia, unspecified: Secondary | ICD-10-CM | POA: Diagnosis not present

## 2018-09-05 LAB — LIPID PANEL
Cholesterol: 159 mg/dL (ref <200)
HDL: 65 mg/dL (ref >=40)
LDL Cholesterol (Calc): 79 mg/dL (calc)
Non-HDL Cholesterol (Calc): 94 mg/dL (calc) (ref <130)
Total CHOL/HDL Ratio: 2.4 (calc) (ref <5.0)
Triglycerides: 71 mg/dL (ref <150)

## 2018-09-05 LAB — COMPLETE METABOLIC PANEL WITH GFR
AG Ratio: 2.1 (calc) (ref 1.0–2.5)
ALT: 11 U/L (ref 9–46)
AST: 12 U/L (ref 10–35)
Albumin: 4.2 g/dL (ref 3.6–5.1)
Alkaline phosphatase (APISO): 67 U/L (ref 35–144)
BUN: 22 mg/dL (ref 7–25)
CO2: 29 mmol/L (ref 20–32)
Calcium: 9.5 mg/dL (ref 8.6–10.3)
Chloride: 104 mmol/L (ref 98–110)
Creat: 0.95 mg/dL (ref 0.70–1.18)
GFR, Est African American: 88 mL/min/{1.73_m2} (ref >=60)
GFR, Est Non African American: 76 mL/min/{1.73_m2} (ref >=60)
Globulin: 2 g/dL (calc) (ref 1.9–3.7)
Glucose, Bld: 89 mg/dL (ref 65–99)
Potassium: 4.5 mmol/L (ref 3.5–5.3)
Sodium: 140 mmol/L (ref 135–146)
Total Bilirubin: 0.7 mg/dL (ref 0.2–1.2)
Total Protein: 6.2 g/dL (ref 6.1–8.1)

## 2018-09-06 DIAGNOSIS — M11262 Other chondrocalcinosis, left knee: Secondary | ICD-10-CM | POA: Diagnosis not present

## 2018-09-06 DIAGNOSIS — M25561 Pain in right knee: Secondary | ICD-10-CM | POA: Diagnosis not present

## 2018-09-06 DIAGNOSIS — R7303 Prediabetes: Secondary | ICD-10-CM | POA: Diagnosis not present

## 2018-09-06 DIAGNOSIS — M1712 Unilateral primary osteoarthritis, left knee: Secondary | ICD-10-CM | POA: Diagnosis not present

## 2018-11-27 ENCOUNTER — Ambulatory Visit: Payer: Medicare Other

## 2018-11-29 ENCOUNTER — Ambulatory Visit: Payer: Medicare Other

## 2019-02-27 ENCOUNTER — Other Ambulatory Visit: Payer: Self-pay

## 2019-02-27 NOTE — Telephone Encounter (Signed)
Left patient a voicemail in regards to medication refill.

## 2019-03-04 NOTE — Telephone Encounter (Signed)
Attempted to reach patient a second time, no answer. Unable to leave voicemail.

## 2019-03-06 NOTE — Telephone Encounter (Signed)
Third attempt to reach patient, unable to leave message, no mailbox available.

## 2019-03-18 ENCOUNTER — Other Ambulatory Visit: Payer: Self-pay

## 2019-03-18 DIAGNOSIS — E782 Mixed hyperlipidemia: Secondary | ICD-10-CM

## 2019-03-18 MED ORDER — LOVASTATIN 20 MG PO TABS: 20.0000 mg | ORAL_TABLET | Freq: Every day | ORAL | 1 refills | Status: AC

## 2019-04-05 ENCOUNTER — Ambulatory Visit: Payer: Medicare Other | Admitting: Urology

## 2019-05-09 ENCOUNTER — Telehealth: Payer: Self-pay

## 2019-05-09 NOTE — Telephone Encounter (Signed)
Received request for refill on Tamsulosin 0.4 mg. Patient has not been seen in a year. Needs appt for refill..  Called patient and left message to schedule appt.

## 2019-08-26 ENCOUNTER — Other Ambulatory Visit: Payer: Self-pay | Admitting: Family Medicine

## 2019-08-26 DIAGNOSIS — E782 Mixed hyperlipidemia: Secondary | ICD-10-CM

## 2019-09-11 ENCOUNTER — Other Ambulatory Visit: Payer: Self-pay | Admitting: Family Medicine

## 2019-09-11 DIAGNOSIS — E782 Mixed hyperlipidemia: Secondary | ICD-10-CM

## 2019-09-12 NOTE — Telephone Encounter (Signed)
lvm on home phone for pt to schedule appt for refills

## 2019-09-12 NOTE — Telephone Encounter (Signed)
Patient need appointment with someone. Over a year since last visit
